# Patient Record
Sex: Male | Born: 1980 | Race: White | Hispanic: No
Health system: Southern US, Community
[De-identification: ages and names within clinical notes are randomized; demographics above are authoritative.]

---

## 2010-06-30 ENCOUNTER — Emergency Department (HOSPITAL_COMMUNITY): Admission: EM | Admit: 2010-06-30 | Discharge: 2010-07-01 | Payer: Self-pay | Admitting: Emergency Medicine

## 2011-11-30 IMAGING — CT CT HEAD W/O CM
1 of 2 series · 16 of 30 positions shown, 20 images · non-contrast
Comparison: None

CLINICAL DATA: Blurred vision, memory loss and confusion, pain,
injury boxing, left periorbital ecchymosis

CT HEAD WITHOUT CONTRAST
TECHNIQUE: Contiguous axial images were obtained from the base of
the skull through the vertex without contrast.

[Series 3: headseq 2.4 h60s · axial · 0.43mm/px · z∈[-186,-34]mm · 16 of 72 slices shown, 20 images]
[im 4/72  brain]
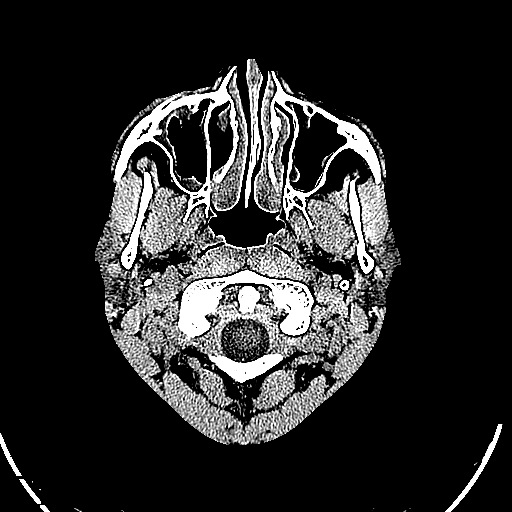
[im 4/72  bone]
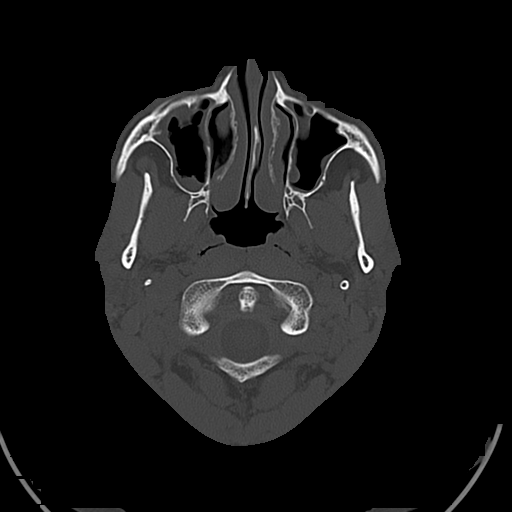
[im 8/72  brain]
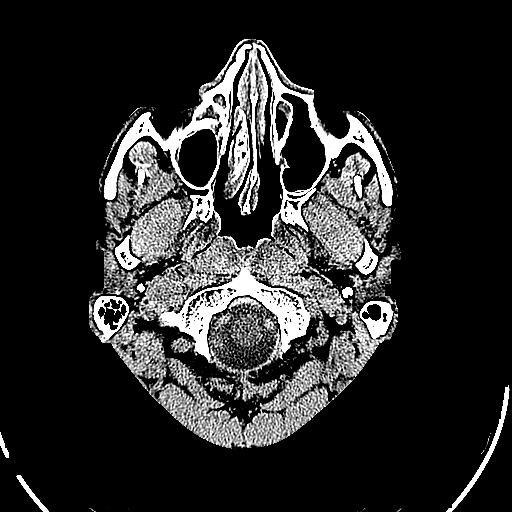
[im 12/72  brain]
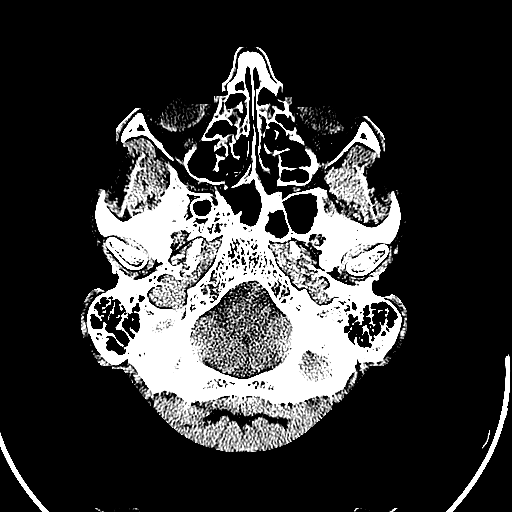
[im 15/72  brain]
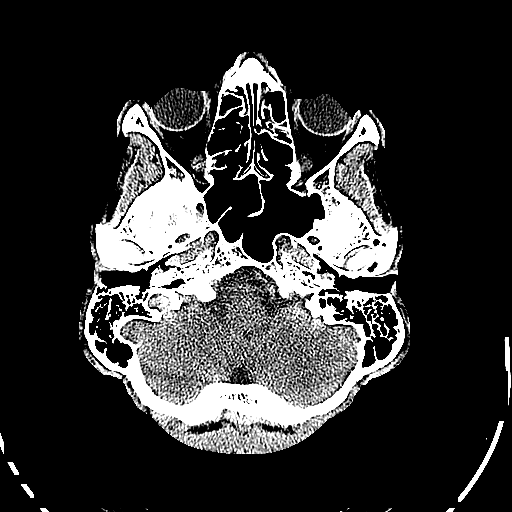
[im 23/72  brain]
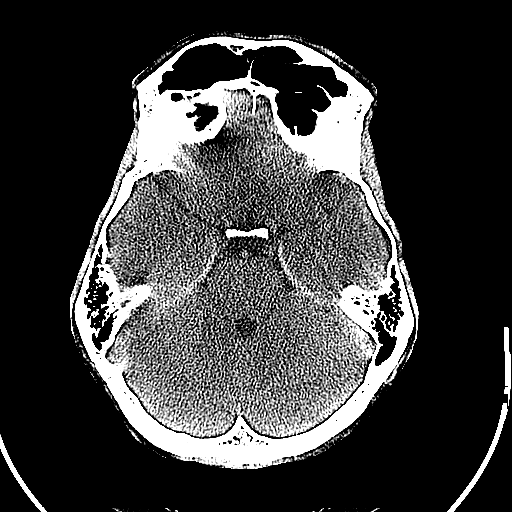
[im 23/72  bone]
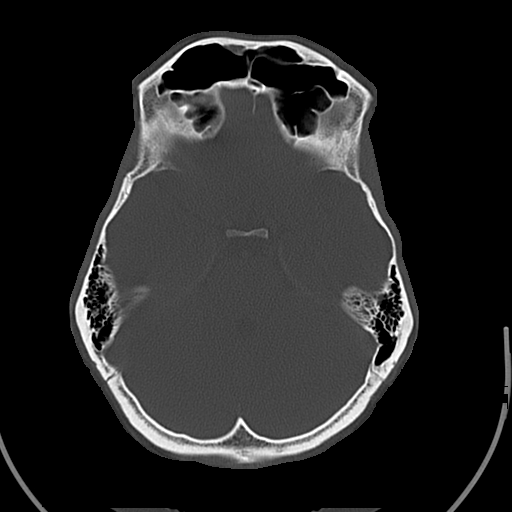
[im 27/72  brain]
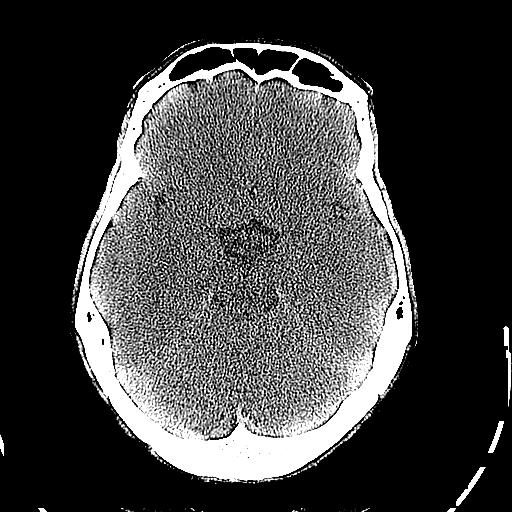
[im 30/72  brain]
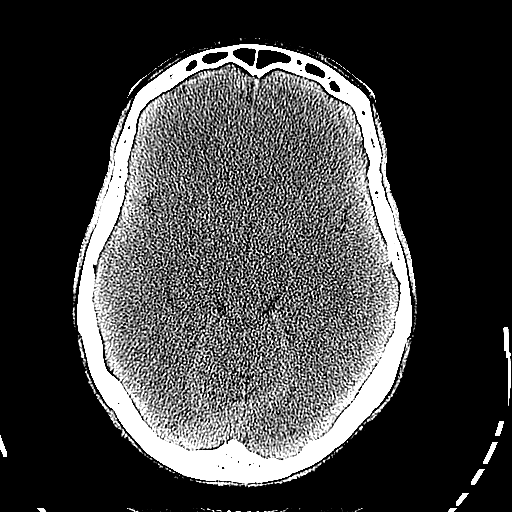
[im 34/72  brain]
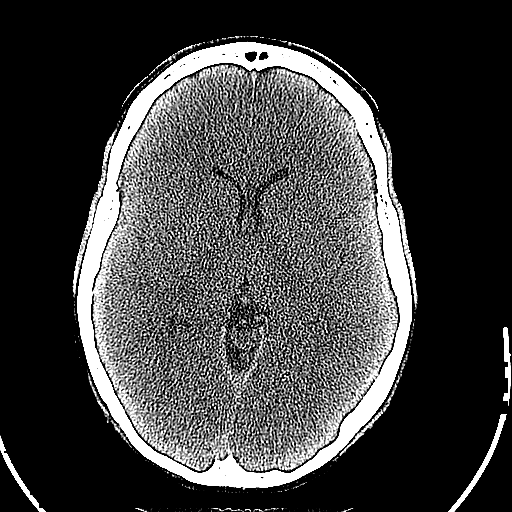
[im 38/72  brain]
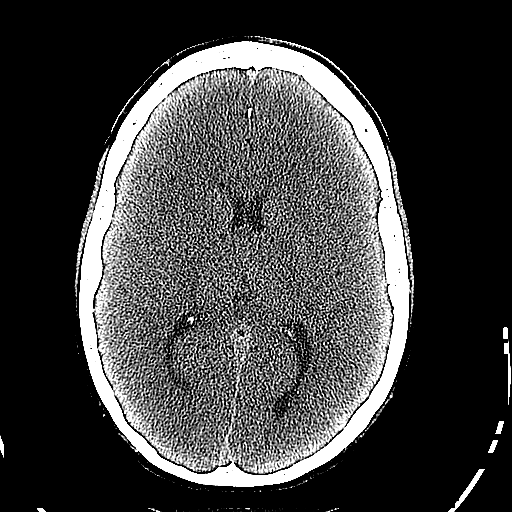
[im 38/72  bone]
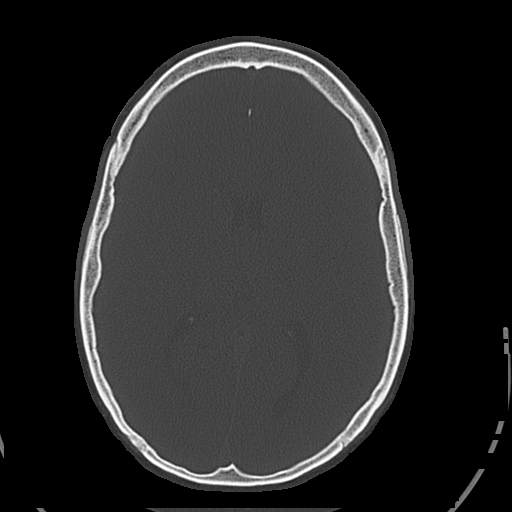
[im 42/72  brain]
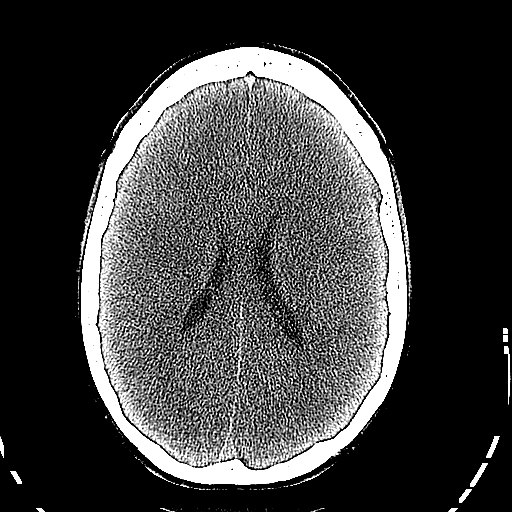
[im 45/72  brain]
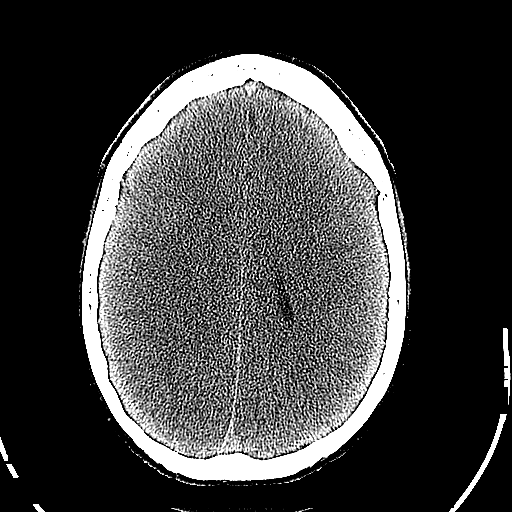
[im 49/72  brain]
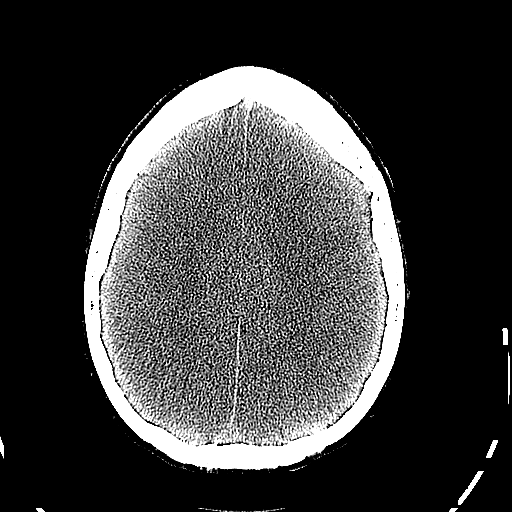
[im 57/72  brain]
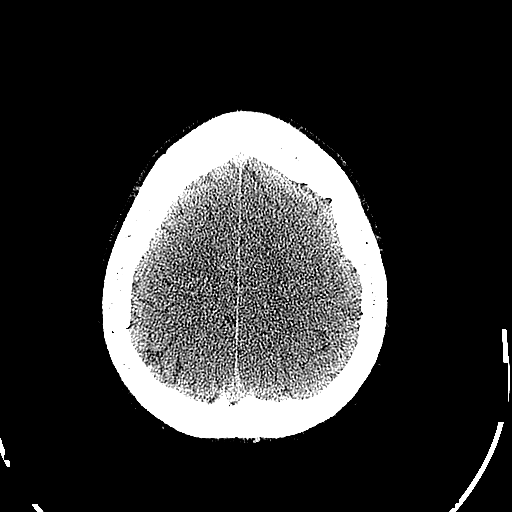
[im 57/72  bone]
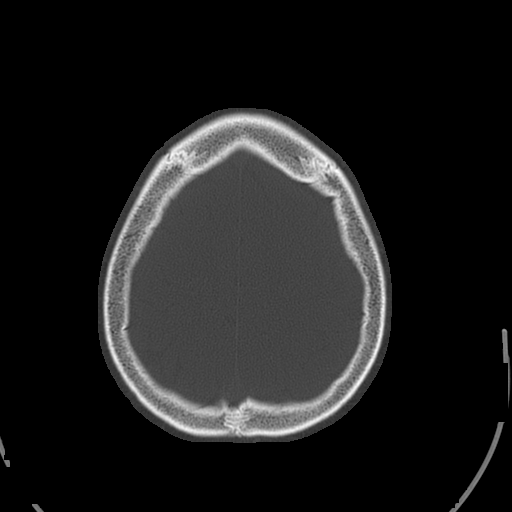
[im 60/72  brain]
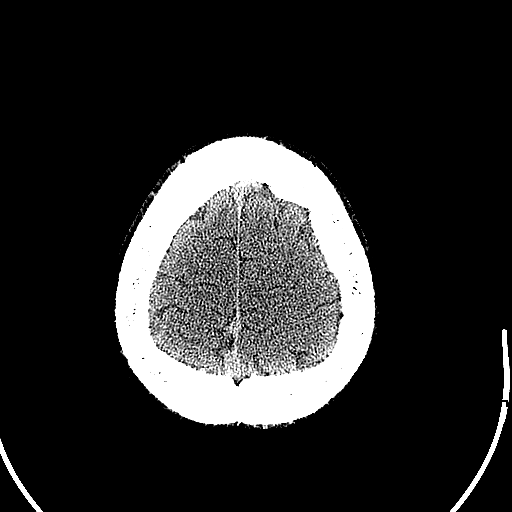
[im 64/72  brain]
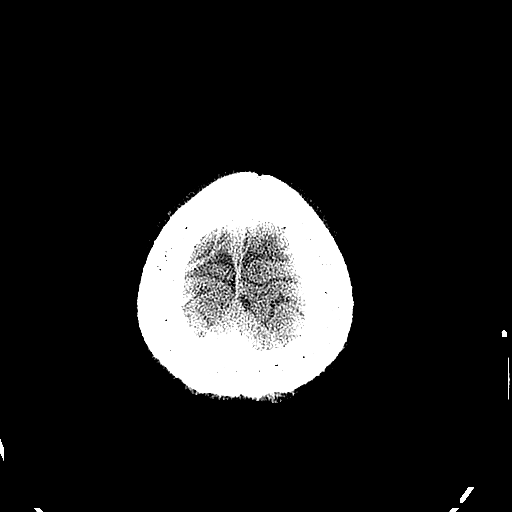
[im 68/72  brain]
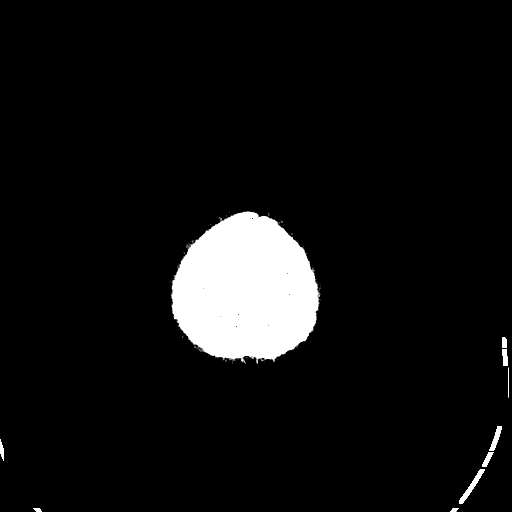

[16 of 30 positions shown; findings below may reference images not displayed]

FINDINGS: Normal ventricular morphology.
No midline shift or mass effect.
Normal appearance of brain parenchyma.
No intracranial hemorrhage, mass lesion, or evidence of acute
infarction.
Osseous structures intact.
Mucosal thickening scattered ethmoid air cells and bilateral
maxillary sinuses, with small mucosal retention cyst in left
maxillary sinus.
IMPRESSION: No acute intracranial abnormalities.

## 2023-01-31 ENCOUNTER — Other Ambulatory Visit: Payer: Self-pay

## 2023-01-31 ENCOUNTER — Other Ambulatory Visit (HOSPITAL_COMMUNITY)
Admission: EM | Admit: 2023-01-31 | Discharge: 2023-02-10 | Payer: Medicaid Other | Attending: Psychiatry | Admitting: Psychiatry

## 2023-01-31 DIAGNOSIS — F22 Delusional disorders: Secondary | ICD-10-CM | POA: Insufficient documentation

## 2023-01-31 DIAGNOSIS — F1514 Other stimulant abuse with stimulant-induced mood disorder: Secondary | ICD-10-CM | POA: Insufficient documentation

## 2023-01-31 DIAGNOSIS — F1994 Other psychoactive substance use, unspecified with psychoactive substance-induced mood disorder: Secondary | ICD-10-CM | POA: Diagnosis not present

## 2023-01-31 DIAGNOSIS — Z79899 Other long term (current) drug therapy: Secondary | ICD-10-CM | POA: Insufficient documentation

## 2023-01-31 DIAGNOSIS — R450 Nervousness: Secondary | ICD-10-CM | POA: Insufficient documentation

## 2023-01-31 DIAGNOSIS — F29 Unspecified psychosis not due to a substance or known physiological condition: Secondary | ICD-10-CM | POA: Insufficient documentation

## 2023-01-31 DIAGNOSIS — F1910 Other psychoactive substance abuse, uncomplicated: Secondary | ICD-10-CM | POA: Diagnosis present

## 2023-01-31 LAB — POCT URINE DRUG SCREEN - MANUAL ENTRY (I-SCREEN)
POC Amphetamine UR: POSITIVE — AB
POC Buprenorphine (BUP): NOT DETECTED
POC Cocaine UR: NOT DETECTED
POC Marijuana UR: NOT DETECTED
POC Methadone UR: NOT DETECTED
POC Methamphetamine UR: POSITIVE — AB
POC Morphine: NOT DETECTED
POC Oxazepam (BZO): POSITIVE — AB
POC Oxycodone UR: NOT DETECTED
POC Secobarbital (BAR): NOT DETECTED

## 2023-01-31 LAB — URINALYSIS, ROUTINE W REFLEX MICROSCOPIC
Bacteria, UA: NONE SEEN
Bilirubin Urine: NEGATIVE
Glucose, UA: NEGATIVE mg/dL
Ketones, ur: NEGATIVE mg/dL
Leukocytes,Ua: NEGATIVE
Nitrite: NEGATIVE
Protein, ur: NEGATIVE mg/dL
Specific Gravity, Urine: 1.006 (ref 1.005–1.030)
pH: 6 (ref 5.0–8.0)

## 2023-01-31 MED ORDER — BUPROPION HCL ER (XL) 300 MG PO TB24
300.0000 mg | ORAL_TABLET | Freq: Once | ORAL | Status: DC
  Filled 2023-01-31: qty 1

## 2023-01-31 MED ORDER — ONDANSETRON 4 MG PO TBDP: 4.0000 mg | ORAL_TABLET | Freq: Four times a day (QID) | ORAL | Status: DC | PRN

## 2023-01-31 MED ORDER — MAGNESIUM HYDROXIDE 400 MG/5ML PO SUSP: 30.0000 mL | Freq: Every day | ORAL | Status: DC | PRN

## 2023-01-31 MED ORDER — METHOCARBAMOL 500 MG PO TABS: 500.0000 mg | ORAL_TABLET | Freq: Three times a day (TID) | ORAL | Status: AC | PRN

## 2023-01-31 MED ORDER — QUETIAPINE FUMARATE 100 MG PO TABS
100.0000 mg | ORAL_TABLET | Freq: Once | ORAL | Status: AC
  Administered 2023-01-31: 100 mg via ORAL
  Filled 2023-01-31: qty 1

## 2023-01-31 MED ORDER — GABAPENTIN 300 MG PO CAPS
300.0000 mg | ORAL_CAPSULE | Freq: Three times a day (TID) | ORAL | Status: DC
  Administered 2023-02-01 – 2023-02-02 (×5): 300 mg via ORAL
  Filled 2023-01-31 (×7): qty 1

## 2023-01-31 MED ORDER — TRAZODONE HCL 50 MG PO TABS
50.0000 mg | ORAL_TABLET | Freq: Every evening | ORAL | Status: DC | PRN
  Administered 2023-02-01 – 2023-02-06 (×5): 50 mg via ORAL
  Filled 2023-01-31 (×7): qty 1

## 2023-01-31 MED ORDER — OLANZAPINE 15 MG PO TBDP
15.0000 mg | ORAL_TABLET | Freq: Every day | ORAL | Status: DC
  Filled 2023-01-31: qty 1

## 2023-01-31 MED ORDER — HYDROXYZINE HCL 25 MG PO TABS
25.0000 mg | ORAL_TABLET | Freq: Four times a day (QID) | ORAL | Status: DC | PRN
  Administered 2023-02-01: 25 mg via ORAL
  Filled 2023-01-31 (×2): qty 1

## 2023-01-31 MED ORDER — DICYCLOMINE HCL 20 MG PO TABS: 20.0000 mg | ORAL_TABLET | Freq: Four times a day (QID) | ORAL | Status: DC | PRN

## 2023-01-31 MED ORDER — ACETAMINOPHEN 325 MG PO TABS: 650.0000 mg | ORAL_TABLET | Freq: Four times a day (QID) | ORAL | Status: DC | PRN

## 2023-01-31 MED ORDER — ALUM & MAG HYDROXIDE-SIMETH 200-200-20 MG/5ML PO SUSP: 30.0000 mL | ORAL | Status: DC | PRN

## 2023-01-31 MED ORDER — NAPROXEN 500 MG PO TABS: 500.0000 mg | ORAL_TABLET | Freq: Two times a day (BID) | ORAL | Status: DC | PRN

## 2023-01-31 MED ORDER — LOPERAMIDE HCL 2 MG PO CAPS: 2.0000 mg | ORAL_CAPSULE | ORAL | Status: DC | PRN

## 2023-01-31 NOTE — ED Notes (Signed)
Pt laying in bed calm and cooperative. Will continue to monitor for safety

## 2023-01-31 NOTE — Discharge Instructions (Addendum)
Dear Wesley Lee,  It was a pleasure to take care of you during your stay at [facility] where you were treated for your Substance induced mood disorder (HCC).  While you were here, you were:  observed and cared for by our nurses and nursing assistants  treated with medications by your psychiatrists  provided individual and group therapy by therapists  provided resources by our social workers and case managers  Please review the medication list provided to you at discharge and stop, start taking, or continue taking the medications listed there.  You should also follow-up with your primary care doctor, or start seeing one if you don't have one yet. If applicable, here are some scheduled follow-ups for you:    I recommend abstinence from alcohol, tobacco, and other illicit drug use.   If your psychiatric symptoms or suicidal thoughts recur, worsen, or if you have side effects to your psychiatric medications, call your outpatient psychiatric provider, 911, [phone] or go to the nearest emergency department.  Take care!  Signed: Augusto Gamble, MD 02/07/2023, 8:56 AM  Naloxone (Narcan) can help reverse an overdose when given to the victim quickly.  [place] offers free naloxone kits and instructions/training on its use.  Add naloxone to your first aid kit and you can help save a life. A prescription can be filled at your local pharmacy or free kits are provided by the county.  Pick up your free kit at the following locations:   [place]:  [facility], [address] [place] [address] [phone]) [facility] [address] [place] [address] [phone]) [facility] [address] [place] [address]  [place]: [facility] [address] [place] [address] ([phone]) [facility] [address] [place]  [place] [address] [phone])  Patient will be discharging to [facility] on [address] [place], [place] [address]: [phone]; Dagoberto [address] of Court Contact information is: Mrs. Raynelle Chary [phone]   HALFWAY HOUSES:  [facility] www.oxfordvacancies.com  12 STEP PROGRAMS:  Alcoholics Anonymous of [place] SoftwareChalet.be  Narcotics Anonymous of [place] HitProtect.dk  Al-Anon of [place], [place] www.[place]alanon.org/find-meetings.html  Nar-Anon https://nar-anon.org/find-a-meetin  List of Residential placements:   [facility] in [place]: [phone]  [facility]: [phone]  [place], [place] [phone]: Male and male facility; 30-day program: (uninsured and Medicaid such as Laurena Bering, [place], [place], partners)  [facility]: [phone]; men and women's facility; 28 days; Can have Medicaid tailored plan Tour manager or Partners)  [facility]: [phone] Karoline Caldwell or Larita Fife; 28 day program; must be fully detox; tailored Medicaid or no insurance  [address] in [place], [place]; [phone]; 28 day all males program; no insurance accepted  [facility] Ref[facility] in [place]: Gabriel Rung [phone] (no insurance or Medicaid only); 90 days; outpatient services but provide housing in apartments [place]  [facility] Admission: [phone]: Patient must complete phone screening for placement: [place], [place]; 6 month program; uninsured, Medicaid, and [employer].   [facility]: no insurance required; [phone]  [facility]: [phone]; Intake: Molly Maduro; Must fill out application online; Alecia Lemming Delay [phone] x [address] [facility] in [place], [place]: [phone]; Admissions Coordinators Mr. Maurine Minister or Barron Alvine; 90 day program.  [facility]: [place], [place] [phone]; Co-Ed 9 month to a  year program; Online application; Men entry fee is $500 (6-69months);  [facility]: [address] [place], [place] [address]; no fee or insurance required; minimum of 2 years; Highly structured; work based; Intake Coordinator is Thayer Ohm [phone]  [facility] in [place], [place]: [phone]; Fax number is [phone]; website: www.[facility].org; Requires 3-6 page autobiography; 2 year program (18 months and then 10month transitional housing); Admission fee is $300; no insurance needed; work Automotive engineer in [place], [place]: [employer] Desk Staff: Danise Edge [phone]: They have a Men's Regenerations Program 6-79months. Free program; There is an initial $300 fee however, they are willing to work with patients regarding that. Application is online.  [facility]: Admissions [phone] Doran Heater ext [phone]; Any 7-90 day program is out of pocket; 12 month program is free of charge; there is a $275 entry fee; Patient is responsible for own transportation

## 2023-01-31 NOTE — ED Provider Notes (Signed)
[facility] Continuous Assessment Admission H&P  Date: 01/31/23 Patient Name: Wesley Lee MRN: [id number] Chief Complaint: "I was sent  Diagnoses:  Final diagnoses:  Substance induced mood disorder [facility])   HPI:  Wesley Lee 42 y.o., male patient presented to [facility] as a walk in , voluntaruky  accompanied by *** with complaints of ***  Wesley Lee, 42 y.o., male patient seen face to face by this provider, consulted with Dr. ***; and chart reviewed on 01/31/23.    On evaluation Wesley Lee reports  During evaluation Wesley Lee is ***(position) in no acute distress.  ***He/She is alert, oriented x 4, calm, cooperative and attentive.  ***His/Her mood is ***euthymic with congruent affect.  ***He/She has normal speech, and behavior.  Objectively there is no evidence of psychosis/mania or delusional thinking.  Patient is able to converse coherently, goal directed thoughts, no distractibility, or pre-occupation.  ***He/She also denies suicidal/self-harm/homicidal ideation, psychosis, and paranoia.  Patient answered question appropriately.      Total Time spent with patient: 45 minutes  Psychiatric Specialty Exam  Presentation General Appearance:  Appropriate for Environment  Eye Contact: Fleeting  Speech: Garbled  Speech Volume: Normal  Handedness: Right   Mood and Affect  Mood: Euphoric  Affect: Appropriate   Thought Process  Thought Processes:No data recorded Descriptions of Associations:Circumstantial  Orientation:Full (Time, Place and Person)  Thought Content:Paranoid Ideation  Diagnosis of Schizophrenia or Schizoaffective disorder in past: No  Duration of Psychotic Symptoms: Less than six months  Hallucinations:Hallucinations: None  Ideas of Reference:None  Suicidal Thoughts:Suicidal Thoughts: No  Homicidal Thoughts:Homicidal Thoughts: No   Sensorium  Memory: Immediate Good; Recent  Good  Judgment: Impaired  Insight: Lacking   Executive Functions  Concentration: Poor  Attention Span: Poor  Recall: [employer] of Knowledge: Fair  Language: Fair   Psychomotor Activity  Psychomotor Activity: Psychomotor Activity: Restlessness   Assets  Assets: Physical Health; Desire for Improvement; Resilience   Sleep  Sleep: Sleep: Poor   No data recorded  Physical Exam ROS  Blood pressure 137/87, pulse 72, temperature 98 F (36.7 C), temperature source Oral, resp. rate 18, SpO2 98 %. There is no height or weight on file to calculate BMI.  Past Psychiatric History: ***   Is the patient at risk to self? No  Has the patient been a risk to self in the past 6 months? No .    Has the patient been a risk to self within the distant past? No     Past Medical History: ***  Family History: ***  Social History: ***    Allergies: Patient has no allergy information on record.  Medications:  Facility Ordered Medications  Medication   acetaminophen (TYLENOL) tablet 650 mg   alum & mag hydroxide-simeth (MAALOX/MYLANTA) 200-200-20 MG/5ML suspension 30 mL   magnesium hydroxide (MILK OF MAGNESIA) suspension 30 mL   traZODone (DESYREL) tablet 50 mg   dicyclomine (BENTYL) tablet 20 mg   hydrOXYzine (ATARAX) tablet 25 mg   loperamide (IMODIUM) capsule 2-4 mg   methocarbamol (ROBAXIN) tablet 500 mg   naproxen (NAPROSYN) tablet 500 mg   ondansetron (ZOFRAN-ODT) disintegrating tablet 4 mg   gabapentin (NEURONTIN) capsule 300 mg   buPROPion (WELLBUTRIN XL) 24 hr tablet 300 mg   OLANZapine zydis (ZYPREXA) disintegrating tablet 15 mg      Medical Decision Making  ***   Patient at present is refusing labs appears manic and paranoid,  Seroquel is on hold, starting Olanzapine.   Recommendations  {  [facility] MSE Recommendations:304701}  Joaquin Courts, NP 01/31/23  5:25 PM

## 2023-01-31 NOTE — ED Notes (Signed)
Patient refusing EKG and blood work - providers made aware

## 2023-01-31 NOTE — BH Assessment (Signed)
Comprehensive Clinical Assessment (CCA) Note  01/31/2023 Wesley Lee [id number] DISPOSITION: Tiburcio Pea NP recommends patient to [facility] once he is less impaired. Patient is recommended for continuous observation at this time for further monitoring.   The patient demonstrates the following risk factors for suicide: Chronic risk factors for suicide include: N/A. Acute risk factors for suicide include: N/A. Protective factors for this patient include: coping skills. Considering these factors, the overall suicide risk at this point appears to be low. Patient is appropriate for outpatient follow up.   Patient is a 42 year old male that presents voluntary as a walk in to [facility] requesting assistance with ongoing SA issues. Patient denies any S/I, H/I or AVH. Patient is observed to be actively impaired and cannot participate in the assessment process. Patient is tangential and difficult to redirect. History is limited per chart review. Patient states he has been using various amounts of methamphetamines although due to current AMS is unable to render any history in reference to current use patterns, time frame, method of ingestion and amounts used. Patient reported he last used three days ago per triage notes although as noted, patient is actively impaired at the time of assessment. Patient per triage note states that patient was sent here as a referral from [facility] in [place] for detox although patient denies that he was ever seen at [facility]. Patient is displaying active flight of ideas and is rambling about topics unrelated to assessment questions. It is unclear if patient is responding to internal stimuli. No additional information could be obtained at this time.   Patient is not answering questions associated with orientation. Patient is displaying active flight of ideas and difficult to redirect. Patient speaks in a loud voice and is very animated walking around assessment room making hand gestures. Patient's memory  is impaired with thoughts disorganized. Patient's mood is agitated/anxious with affect congruent. It is unclear if patient is responding to internal stimuli.         Chief Complaint:  Chief Complaint  Patient presents with   Addiction Problem   Visit Diagnosis: SA induced mood disorder.     CCA Screening, Triage and Referral (STR)  Patient Reported Information How did you hear about [place]? Self  What Is the Reason for Your Visit/Call Today? Pt presents to [facility] voluntarily seeking detox and substance use treatment. Pt states he was sent to this facility from [facility] in [place]. Pt appears to be under the influence of a substance; slurred speech, inability to sit still, unable to keep eye contact. 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 Pt denies SI/HI and AVH.  How Long Has This Been Causing You Problems? > than 6 months  What Do You Feel Would Help You the Most Today? Alcohol or Drug Use Treatment   Have You Recently Had Any Thoughts About Hurting Yourself? No  Are You Planning to Commit Suicide/Harm Yourself At This time? No   Flowsheet Row ED from 01/31/2023 in [facility]  C-SSRS RISK CATEGORY No Risk       Have you Recently Had Thoughts About Hurting Someone Karolee Ohs? No  Are You Planning to Harm Someone at This Time? No  Explanation: NA   Have You Used Any Alcohol or Drugs in the Past 24 Hours? No  What Did You Use and How Much? Patient states last use of any substances was three days ago   Do You Currently Have a Therapist/Psychiatrist? No  Name of Therapist/Psychiatrist: Name of Therapist/Psychiatrist: NA   Have You Been Recently Discharged From Any Office Practice or Programs? No  Explanation of Discharge From Practice/Program: NA     CCA Screening Triage Referral Assessment Type of Contact: Face-to-Face  Telemedicine Service Delivery:   Is this Initial or Reassessment?   Date Telepsych  consult ordered in [facility]:    Time Telepsych consult ordered in [facility]:    Location of Assessment: [facility]  Provider Location: [facility] Services   Collateral Involvement: None at this time   Does Patient Have a Automotive engineer Guardian? No  Legal Guardian Contact Information: NA  Copy of Legal Guardianship Form: -- (NA)  Legal Guardian Notified of Arrival: -- (NA)  Legal Guardian Notified of Pending Discharge: -- (NA)  If Minor and Not Living with Parent(s), Who has Custody? NA  Is CPS involved or ever been involved? Never  Is APS involved or ever been involved? Never   Patient Determined To Be At Risk for Harm To Self or Others Based on Review of Patient Reported Information or Presenting Complaint? No  Method: No Plan  Availability of Means: No access or NA  Intent: Vague intent or NA  Notification Required: No need or identified person  Additional Information for Danger to Others Potential: -- (NA)  Additional Comments for Danger to Others Potential: None noted  Are There Guns or Other Weapons in Your Home? No  Types of Guns/Weapons: Patient denies  Are These Weapons Safely Secured?                            -- (NA)  Who Could Verify You Are Able To Have These Secured: NA  Do You Have any Outstanding Charges, Pending Court Dates, Parole/Probation? Patient denies  Contacted To Inform of Risk of Harm To Self or Others: Other: Comment (NA)    Does Patient Present under Involuntary Commitment? No    [place] of Residence: [place]   Patient Currently Receiving the Following Services: Not Receiving Services   Determination of Need: Urgent (48 hours)   Options For Referral: Facility-Based Crisis     CCA Biopsychosocial Patient Reported Schizophrenia/Schizoaffective Diagnosis in Past: No   Strengths: Patient is willing to participate in treatment and is open to recovery interventions   Mental Health  Symptoms Depression:   Change in energy/activity; Difficulty Concentrating; Hopelessness   Duration of Depressive symptoms:  Duration of Depressive Symptoms: Greater than two weeks   Mania:   None   Anxiety:    Difficulty concentrating   Psychosis:   None   Duration of Psychotic symptoms:  Duration of Psychotic Symptoms: N/A   Trauma:   None   Obsessions:   None   Compulsions:   None   Inattention:   None   Hyperactivity/Impulsivity:   None   Oppositional/Defiant Behaviors:   None   Emotional Irregularity:   Chronic feelings of emptiness   Other Mood/Personality Symptoms:   None noted    Mental Status Exam Appearance and self-care  Stature:   Average   Weight:   Average weight   Clothing:   Casual   Grooming:   Normal   Cosmetic use:   None   Posture/gait:   Bizarre   Motor activity:   Restless   Sensorium  Attention:   Distractible   Concentration:   Preoccupied; Scattered   Orientation:   X5   Recall/memory:   Defective  in Immediate   Affect and Mood  Affect:   Anxious   Mood:   Anxious   Relating  Eye contact:   Fleeting   Facial expression:   Anxious   Attitude toward examiner:   Cooperative   Thought and Language  Speech flow:  Soft; Slurred   Thought content:   Appropriate to Mood and Circumstances   Preoccupation:   None   Hallucinations:   None   Organization:   Disorganized   Company secretary of Knowledge:   Poor   Intelligence:   Needs investigation   Abstraction:   Functional   Judgement:   Impaired   Reality Testing:   Variable   Insight:   Fair   Decision Making:   Only simple   Social Functioning  Social Maturity:   Responsible   Social Judgement:   Normal   Stress  Stressors:   Other (Comment) (Ongoing SA issues)   Coping Ability:   Overwhelmed   Skill Deficits:   Self-control   Supports:   Usual     Religion: Religion/Spirituality Are  You A Religious Person?: No How Might This Affect Treatment?: NA  Leisure/Recreation: Leisure / Recreation Do You Have Hobbies?: No  Exercise/Diet: Exercise/Diet Do You Exercise?: No Have You Gained or Lost A Significant Amount of Weight in the Past Six Months?: No Do You Follow a Special Diet?: No Do You Have Any Trouble Sleeping?: Yes Explanation of Sleeping Difficulties: Patient states he has not slept in the last 48 hours   CCA Employment/Education Employment/Work Situation: Employment / Work Situation Employment Situation: Unemployed Patient's Job has Been Impacted by Current Illness: No Has Patient ever Been in Equities trader?: No  Education: Education Is Patient Currently Attending School?: No Last Grade Completed: 12 Did You Product manager?: No Did You Have An Individualized Education Program (IIEP): No Did You Have Any Difficulty At [employer]?: No Patient's Education Has Been Impacted by Current Illness: No   CCA Family/Childhood History Family and Relationship History: Family history Marital status: Single Does patient have children?: No  Childhood History:  Childhood History By whom was/is the patient raised?: Both parents Did patient suffer any verbal/emotional/physical/sexual abuse as a child?: No Did patient suffer from severe childhood neglect?: No Has patient ever been sexually abused/assaulted/raped as an adolescent or adult?: No Was the patient ever a victim of a crime or a disaster?: No Witnessed domestic violence?: No Has patient been affected by domestic violence as an adult?: No       CCA Substance Use Alcohol/Drug Use: Alcohol / Drug Use Pain Medications: See MAR Prescriptions: See MAR Over the Counter: See MAR History of alcohol / drug use?: Yes Longest period of sobriety (when/how long): Unknown UTA due patient AMS Negative Consequences of Use:  (UTA) Withdrawal Symptoms:  (UTA) Substance #1 Name of Substance 1: Methamphetamines 1 -  Age of First Use: UTA due to patient being actively impaired at the time of assessment 1 - Amount (size/oz): UTA due to patient being actively impaired at the time of assessment 1 - Frequency: UTA due to patient being actively impaired at the time of assessment 1 - Duration: UTA due to patient being actively impaired at the time of assessment 1 - Last Use / Amount: patient per notes reported 3 days ago although per observation patient is currently impaired 1 - Method of Aquiring: illegally 1- Route of Use: UTA  ASAM's:  Six Dimensions of Multidimensional Assessment  Dimension 1:  Acute Intoxication and/or Withdrawal Potential:   Dimension 1:  Description of individual's past and current experiences of substance use and withdrawal: UTA  Dimension 2:  Biomedical Conditions and Complications:   Dimension 2:  Description of patient's biomedical conditions and  complications: UTA  Dimension 3:  Emotional, Behavioral, or Cognitive Conditions and Complications:  Dimension 3:  Description of emotional, behavioral, or cognitive conditions and complications: UTA  Dimension 4:  Readiness to Change:  Dimension 4:  Description of Readiness to Change criteria: UTA  Dimension 5:  Relapse, Continued use, or Continued Problem Potential:  Dimension 5:  Relapse, continued use, or continued problem potential critiera description: UTA  Dimension 6:  Recovery/Living Environment:  Dimension 6:  Recovery/Iiving environment criteria description: UTA  ASAM Severity Score:    ASAM Recommended Level of Treatment: ASAM Recommended Level of Treatment:  (UTA)   Substance use Disorder (SUD) Substance Use Disorder (SUD)  Checklist Symptoms of Substance Use:  (UTA)  Recommendations for Services/Supports/Treatments: Recommendations for Services/Supports/Treatments Recommendations For Services/Supports/Treatments: Other (Comment) (FBC once patient becomes more stable)  Discharge  Disposition:    DSM5 Diagnoses: There are no problems to display for this patient.    Referrals to Alternative Service(s): Referred to Alternative Service(s):   Place:   Date:   Time:    Referred to Alternative Service(s):   Place:   Date:   Time:    Referred to Alternative Service(s):   Place:   Date:   Time:    Referred to Alternative Service(s):   Place:   Date:   Time:     Alfredia Ferguson, LCAS

## 2023-02-01 ENCOUNTER — Encounter (HOSPITAL_COMMUNITY): Payer: Self-pay | Admitting: Registered Nurse

## 2023-02-01 DIAGNOSIS — F1994 Other psychoactive substance use, unspecified with psychoactive substance-induced mood disorder: Secondary | ICD-10-CM | POA: Diagnosis not present

## 2023-02-01 DIAGNOSIS — F1514 Other stimulant abuse with stimulant-induced mood disorder: Secondary | ICD-10-CM | POA: Diagnosis not present

## 2023-02-01 DIAGNOSIS — F1910 Other psychoactive substance abuse, uncomplicated: Secondary | ICD-10-CM | POA: Diagnosis present

## 2023-02-01 DIAGNOSIS — R450 Nervousness: Secondary | ICD-10-CM | POA: Diagnosis not present

## 2023-02-01 DIAGNOSIS — F22 Delusional disorders: Secondary | ICD-10-CM | POA: Diagnosis not present

## 2023-02-01 MED ORDER — QUETIAPINE FUMARATE 100 MG PO TABS
100.0000 mg | ORAL_TABLET | Freq: Once | ORAL | Status: AC
  Administered 2023-02-01: 100 mg via ORAL
  Filled 2023-02-01: qty 1

## 2023-02-01 NOTE — ED Provider Notes (Addendum)
Facility Based Crisis Admission H&P  Date: 02/03/23 Patient Name: Wesley Lee MRN: [id number] Chief Complaint: Detox request  Diagnoses:  Final diagnoses:  Substance induced mood disorder (HCC)  Polysubstance abuse [facility])    HPI: Wesley Lee 42 y/o, male patient presented to [facility] as a walk in voluntarily requesting assistance with detox and treatment.    Wesley Lee seen face to face by this provider, consulted with Dr. Gretta Cool; and chart reviewed on 02/03/23.  On evaluation Wesley Lee reports he feels "alright"  States that he cam in because he needed detox for meth and alcohol.  Patient states that he was stay at a half way house "I can't remember the name but [facility] is who help me get in.  But they said that I had to do a detox program first."  Patient states that he is eating/sleeping without difficulty.  He denies suicidal/self-harm/homicidal ideation, psychosis, and paranoia.   During evaluation Wesley Lee is lying in bed with no noted distress.  He is alert/oriented x 4, calm, cooperative, attentive, and responses were relevant and appropriate to assessment questions.  He spoke in a clear tone at moderate volume, and normal pace, with good eye contact.   He denies suicidal/self-harm/homicidal ideation, psychosis, and paranoia.  Objectively:  there is no evidence of psychosis/mania or delusional thinking.  He conversed coherently, with goal directed thoughts, and no distractibility, or pre-occupation.     PHQ 2-9:   Flowsheet Row ED from 01/31/2023 in [facility]  C-SSRS RISK CATEGORY No Risk       Screenings    Flowsheet Row Most Recent Value  CIWA-Ar Total 0  COWS Total Score 0      Total Time spent with patient: 30 minutes  Musculoskeletal  Strength & Muscle Tone: within normal limits Gait & Station: normal Patient leans: N/A  Psychiatric Specialty Exam  Presentation General Appearance:  Appropriate  for Environment  Eye Contact: Fair  Speech: Clear and Coherent; Normal Rate  Speech Volume: Normal  Handedness: Right   Mood and Affect  Mood: Euthymic  Affect: Congruent   Thought Process  Thought Processes:Coherent  Descriptions of Associations:Intact  Orientation:Full (Time, Place and Person)  Thought Content:Logical  Diagnosis of Schizophrenia or Schizoaffective disorder in past: No   Hallucinations:Hallucinations: None  Ideas of Reference:None  Suicidal Thoughts:Suicidal Thoughts: No  Homicidal Thoughts:Homicidal Thoughts: No   Sensorium  Memory: -- (Grossly intact)  Judgment: Fair  Insight: Fair   Art therapist  Concentration: Fair  Attention Span: Fair  Recall: -- (N/A)  Fund of Knowledge: Fair  Language: Fair   Psychomotor Activity  Psychomotor Activity: Psychomotor Activity: Normal   Assets  Assets: Desire for Improvement; Communication Skills; Resilience   Sleep  Sleep: Sleep: Good   No data recorded  Physical Exam Vitals and nursing note reviewed.  Constitutional:      General: He is not in acute distress.    Appearance: Normal appearance. He is not ill-appearing.  HENT:     Head: Normocephalic.  Eyes:     Conjunctiva/sclera: Conjunctivae normal.  Cardiovascular:     Rate and Rhythm: Normal rate.  Pulmonary:     Effort: Pulmonary effort is normal.  Musculoskeletal:        General: Normal range of motion.     Cervical back: Normal range of motion.  Skin:    General: Skin is warm and dry.  Neurological:     Mental Status: He is alert and  oriented to person, place, and time.  Psychiatric:        Attention and Perception: Attention and perception normal. He does not perceive auditory or visual hallucinations.        Mood and Affect: Mood and affect normal.        Speech: Speech normal.        Behavior: Behavior normal. Behavior is cooperative.        Thought Content: Thought content normal.  Thought content is not paranoid or delusional. Thought content does not include homicidal or suicidal ideation.        Cognition and Memory: Cognition normal.    Review of Systems  Constitutional:        No complaints voiced   Psychiatric/Behavioral:  Positive for substance abuse. Negative for hallucinations and suicidal ideas. Depression: Stable.The patient does not have insomnia.   All other systems reviewed and are negative.   Blood pressure 133/89, pulse 66, temperature (!) 97.4 F (36.3 C), temperature source Oral, resp. rate 17, SpO2 97 %. There is no height or weight on file to calculate BMI.  Past Psychiatric History:  Patient Active Problem List   Diagnosis Date Noted   Substance induced mood disorder (HCC) 02/01/2023   Polysubstance abuse (HCC) 02/01/2023      Is the patient at risk to self? No  Has the patient been a risk to self in the past 6 months? No .    Has the patient been a risk to self within the distant past? No   Is the patient a risk to others? No   Has the patient been a risk to others in the past 6 months? No   Has the patient been a risk to others within the distant past? No   Past Medical History: History reviewed. No pertinent past medical history.   None reported Family History: History reviewed. No pertinent family history.   None reported Social History: Polysubstance abuse, currently living in half way house  Last Labs:  Admission on 01/31/2023  Component Date Value Ref Range Status   Color, Urine 01/31/2023 YELLOW  YELLOW Final   APPearance 01/31/2023 CLEAR  CLEAR Final   Specific Gravity, Urine 01/31/2023 1.006  1.005 - 1.030 Final   pH 01/31/2023 6.0  5.0 - 8.0 Final   Glucose, UA 01/31/2023 NEGATIVE  NEGATIVE mg/dL Final   Hgb urine dipstick 01/31/2023 SMALL (A)  NEGATIVE Final   Bilirubin Urine 01/31/2023 NEGATIVE  NEGATIVE Final   Ketones, ur 01/31/2023 NEGATIVE  NEGATIVE mg/dL Final   Protein, ur 16/07/9603 NEGATIVE  NEGATIVE mg/dL  Final   Nitrite 54/05/8118 NEGATIVE  NEGATIVE Final   Leukocytes,Ua 01/31/2023 NEGATIVE  NEGATIVE Final   RBC / HPF 01/31/2023 0-5  0 - 5 RBC/hpf Final   WBC, UA 01/31/2023 0-5  0 - 5 WBC/hpf Final   Bacteria, UA 01/31/2023 NONE SEEN  NONE SEEN Final   Squamous Epithelial / HPF 01/31/2023 0-5  0 - 5 /HPF Final   Performed at [facility], [address] [place], [place] [address]   POC Amphetamine UR 01/31/2023 Positive (A)  NONE DETECTED (Cut Off Level 1000 ng/mL) Final   POC Secobarbital (BAR) 01/31/2023 None Detected  NONE DETECTED (Cut Off Level 300 ng/mL) Final   POC Buprenorphine (BUP) 01/31/2023 None Detected  NONE DETECTED (Cut Off Level 10 ng/mL) Final   POC Oxazepam (BZO) 01/31/2023 Positive (A)  NONE DETECTED (Cut Off Level 300 ng/mL) Final   POC Cocaine UR 01/31/2023  None Detected  NONE DETECTED (Cut Off Level 300 ng/mL) Final   POC Methamphetamine UR 01/31/2023 Positive (A)  NONE DETECTED (Cut Off Level 1000 ng/mL) Final   POC Morphine 01/31/2023 None Detected  NONE DETECTED (Cut Off Level 300 ng/mL) Final   POC Methadone UR 01/31/2023 None Detected  NONE DETECTED (Cut Off Level 300 ng/mL) Final   POC Oxycodone UR 01/31/2023 None Detected  NONE DETECTED (Cut Off Level 100 ng/mL) Final   POC Marijuana UR 01/31/2023 None Detected  NONE DETECTED (Cut Off Level 50 ng/mL) Final    Allergies: Patient has no known allergies.  Medications:  Facility Ordered Medications  Medication   acetaminophen (TYLENOL) tablet 650 mg   alum & mag hydroxide-simeth (MAALOX/MYLANTA) 200-200-20 MG/5ML suspension 30 mL   magnesium hydroxide (MILK OF MAGNESIA) suspension 30 mL   traZODone (DESYREL) tablet 50 mg   methocarbamol (ROBAXIN) tablet 500 mg   ondansetron (ZOFRAN-ODT) disintegrating tablet 4 mg   buPROPion (WELLBUTRIN XL) 24 hr tablet 300 mg   [COMPLETED] QUEtiapine (SEROQUEL) tablet 100 mg   [COMPLETED] QUEtiapine (SEROQUEL) tablet 100 mg   buPROPion (WELLBUTRIN XL) 24 hr tablet  300 mg   gabapentin (NEURONTIN) capsule 800 mg   thiamine (VITAMIN B1) injection 100 mg   thiamine (VITAMIN B1) tablet 100 mg   multivitamin with minerals tablet 1 tablet   LORazepam (ATIVAN) tablet 1 mg   hydrOXYzine (ATARAX) tablet 25 mg   loperamide (IMODIUM) capsule 2-4 mg   PTA Medications  Medication Sig   gabapentin (NEURONTIN) 800 MG tablet Take 800 mg by mouth 3 (three) times daily.   buPROPion (WELLBUTRIN XL) 300 MG 24 hr tablet Take 300 mg by mouth daily.   QUEtiapine (SEROQUEL) 100 MG tablet Take 100 mg by mouth at bedtime.    Long Term Goals: Improvement in symptoms so as ready for discharge  Short Term Goals: Patient will verbalize feelings in meetings with treatment team members., Patient will attend at least of 50% of the groups daily., Pt will complete the PHQ9 on admission, day 3 and discharge., Patient will participate in completing the [place] Suicide Severity Rating Scale, Patient will score a low risk of violence for 24 hours prior to discharge, and Patient will take medications as prescribed daily.  Medical Decision Making  Wesley Lee was admitted to [facility] base crisis unit under the service of Nelly Rout, MD for Substance induced mood disorder [facility]), crisis management, and stabilization. Routine labs reviewed which include Lab Orders         CBC with Differential/Platelet         Comprehensive metabolic panel         Hemoglobin A1c         TSH         Ethanol         Lipid panel         Urinalysis, Routine w reflex microscopic -Urine, Clean Catch         POCT Urine Drug Screen - (I-Screen)    Medication Management: Medications started Meds ordered this encounter  Medications   acetaminophen (TYLENOL) tablet 650 mg   alum & mag hydroxide-simeth (MAALOX/MYLANTA) 200-200-20 MG/5ML suspension 30 mL   magnesium hydroxide (MILK OF MAGNESIA) suspension 30 mL   traZODone (DESYREL) tablet 50 mg   DISCONTD:  dicyclomine (BENTYL) tablet 20 mg   DISCONTD: hydrOXYzine (ATARAX) tablet 25 mg   DISCONTD: loperamide (IMODIUM) capsule 2-4 mg   methocarbamol (ROBAXIN) tablet  500 mg   DISCONTD: naproxen (NAPROSYN) tablet 500 mg   ondansetron (ZOFRAN-ODT) disintegrating tablet 4 mg   DISCONTD: gabapentin (NEURONTIN) capsule 300 mg   buPROPion (WELLBUTRIN XL) 24 hr tablet 300 mg   DISCONTD: OLANZapine zydis (ZYPREXA) disintegrating tablet 15 mg   QUEtiapine (SEROQUEL) tablet 100 mg   QUEtiapine (SEROQUEL) tablet 100 mg   buPROPion (WELLBUTRIN XL) 24 hr tablet 300 mg   gabapentin (NEURONTIN) capsule 800 mg   thiamine (VITAMIN B1) injection 100 mg   thiamine (VITAMIN B1) tablet 100 mg   multivitamin with minerals tablet 1 tablet   LORazepam (ATIVAN) tablet 1 mg   hydrOXYzine (ATARAX) tablet 25 mg   loperamide (IMODIUM) capsule 2-4 mg   Will maintain observation checks every 15 minutes for safety. Psychosocial education regarding relapse prevention and self-care; social and communication  Social work will consult with patient to discuss discharge and follow up plan.  Recommendations  Based on my evaluation the patient does not appear to have an emergency medical condition.  Wesley Antone, NP 02/03/23  2:01 PM

## 2023-02-01 NOTE — ED Notes (Signed)
No sxs of distress noted - will continue to observe for safety

## 2023-02-01 NOTE — ED Notes (Signed)
Pt sleeping@this time. Breathing even and unlabored. Will continue to monitor for safety 

## 2023-02-01 NOTE — ED Provider Notes (Incomplete)
[facility] Continuous Assessment Admission H&P  Date: 01/31/23 Patient Name: Estol Garris MRN: [id number] Chief Complaint: "I was sent  Diagnoses:  Final diagnoses:  Substance induced mood disorder [facility])   HPI:  Jaun Hindmarsh 42 y.o., male patient presented to [facility] as a walk in , voluntarily, alone, transported to [facility] via cab.  with complaints of ***  [name], 42 y.o., male patient seen face to face by this provider, consulted with Dr. Lucianne Muss; and chart reviewed on 01/31/23.    On evaluation Hayato Levi reports that he is here at [facility] for urgent care for detox.  Patient reports that he last used methamphetamines 3 days ago and want to go into long-term treatment facility at [facility] however was advised to come here to detox.  On evaluation patient is severely manic unable to remain still, unable to pick simple direction to remain in the room and to sit still.  Patient is constantly up running around the hallway and also his room and requires multiple attempts at redirection before patient will actively listened and engage in the conversation.  Patient reports that he was diagnosed bipolar disorder but he does not believe he has however patient reports to me that he takes the following medications: Gabapentin 300 mg 3 times daily and this is for foot neuropathy, BuSpar 15 mg twice a day for anxiety, "100 mg at bedtime, patient is unable to tell this writer needs the last time she seen a psychiatric provider however endorses that she is currently being linked up with [facility] services closer to home in [place].  However patient does not meet inpatient criteria for safety patient is manic unable to respect boundaries constantly getting in the face of other staff members trying to elope from the facility speaking inappropriately threatening to kill others and harm herself.  Patient meets IVC criteria.  During evaluation Bram Hasser is  sitting, in no acute distress.  She is alert, oriented x 3, anxious, calm, cooperative and attentive.  ***His/Her mood is ***euthymic with congruent affect.  ***He/She has normal speech, and behavior.  Objectively there is no evidence of psychosis/mania or delusional thinking.  Patient is able to converse coherently, goal directed thoughts, no distractibility, or pre-occupation.  ***He/She also denies suicidal/self-harm/homicidal ideation, psychosis, and paranoia.  Patient answered question appropriately.      Total Time spent with patient: 45 minutes  Psychiatric Specialty Exam  Presentation General Appearance:  Appropriate for Environment  Eye Contact: Fleeting  Speech: Garbled  Speech Volume: Normal  Handedness: Right   Mood and Affect  Mood: Euphoric  Affect: Appropriate   Thought Process  Thought Processes:No data recorded Descriptions of Associations:Circumstantial  Orientation:Full (Time, Place and Person)  Thought Content:Paranoid Ideation  Diagnosis of Schizophrenia or Schizoaffective disorder in past: No  Duration of Psychotic Symptoms: Less than six months  Hallucinations:Hallucinations: None  Ideas of Reference:None  Suicidal Thoughts:Suicidal Thoughts: No  Homicidal Thoughts:Homicidal Thoughts: No   Sensorium  Memory: Immediate Good; Recent Good  Judgment: Impaired  Insight: Lacking   Executive Functions  Concentration: Poor  Attention Span: Poor  Recall: [employer] of Knowledge: Fair  Language: Fair   Psychomotor Activity  Psychomotor Activity: Psychomotor Activity: Restlessness   Assets  Assets: Physical Health; Desire for Improvement; Resilience   Sleep  Sleep: Sleep: Poor   No data recorded  Physical Exam ROS  Blood pressure 137/87, pulse 72, temperature 98 F (36.7 C), temperature source Oral, resp. rate 18,  SpO2 98 %. There is no height or weight on file to calculate BMI.  Past Psychiatric History:  ***   Is the patient at risk to self? No  Has the patient been a risk to self in the past 6 months? No .    Has the patient been a risk to self within the distant past? No     Past Medical History: ***  Family History: ***  Social History: ***    Allergies: Patient has no allergy information on record.  Medications:  Facility Ordered Medications  Medication  . acetaminophen (TYLENOL) tablet 650 mg  . alum & mag hydroxide-simeth (MAALOX/MYLANTA) 200-200-20 MG/5ML suspension 30 mL  . magnesium hydroxide (MILK OF MAGNESIA) suspension 30 mL  . traZODone (DESYREL) tablet 50 mg  . dicyclomine (BENTYL) tablet 20 mg  . hydrOXYzine (ATARAX) tablet 25 mg  . loperamide (IMODIUM) capsule 2-4 mg  . methocarbamol (ROBAXIN) tablet 500 mg  . naproxen (NAPROSYN) tablet 500 mg  . ondansetron (ZOFRAN-ODT) disintegrating tablet 4 mg  . gabapentin (NEURONTIN) capsule 300 mg  . buPROPion (WELLBUTRIN XL) 24 hr tablet 300 mg  . OLANZapine zydis (ZYPREXA) disintegrating tablet 15 mg      Medical Decision Making  ***   Patient at present is refusing labs appears manic and paranoid,  Seroquel is on hold, starting Olanzapine.   Recommendations  {[facility] MSE Recommendations:304701}  Joaquin Courts, NP 01/31/23  5:25 PM

## 2023-02-01 NOTE — ED Notes (Signed)
Patient accepted scheduled meds w/o difficulty. Responded appropriately to questions from staff when asked. Breakfast provided. Safety maintained and will continue to monitor.

## 2023-02-01 NOTE — ED Notes (Signed)
Patient is resting in bed with no sxs of distress noted - will continue to observe for safety 

## 2023-02-01 NOTE — ED Notes (Signed)
Snacks given 

## 2023-02-01 NOTE — ED Notes (Signed)
Pt is alert and oriented. Pt has flat affect that brightens upon engagement.  Mood is congruent.  Pt denies SI, HI or AVH.  Reports that he is attempting to detox off of Methamphetamines.  Pt brought to [facility] by [facility] RN from [facility].  Gait even and steady. Pt oriented to rules of FBC , room and milieu.  He was given dinner and is currently watching tv with peers.

## 2023-02-01 NOTE — ED Notes (Signed)
Did not attend AA Group meeting

## 2023-02-02 DIAGNOSIS — F1514 Other stimulant abuse with stimulant-induced mood disorder: Secondary | ICD-10-CM | POA: Diagnosis not present

## 2023-02-02 DIAGNOSIS — F22 Delusional disorders: Secondary | ICD-10-CM | POA: Diagnosis not present

## 2023-02-02 DIAGNOSIS — R450 Nervousness: Secondary | ICD-10-CM | POA: Diagnosis not present

## 2023-02-02 DIAGNOSIS — F1994 Other psychoactive substance use, unspecified with psychoactive substance-induced mood disorder: Secondary | ICD-10-CM | POA: Diagnosis not present

## 2023-02-02 MED ORDER — THIAMINE MONONITRATE 100 MG PO TABS
100.0000 mg | ORAL_TABLET | Freq: Every day | ORAL | Status: DC
  Administered 2023-02-03 – 2023-02-09 (×7): 100 mg via ORAL
  Filled 2023-02-02 (×8): qty 1

## 2023-02-02 MED ORDER — GABAPENTIN 400 MG PO CAPS
800.0000 mg | ORAL_CAPSULE | Freq: Three times a day (TID) | ORAL | Status: DC
  Administered 2023-02-02 – 2023-02-10 (×24): 800 mg via ORAL
  Filled 2023-02-02 (×5): qty 2
  Filled 2023-02-02: qty 84
  Filled 2023-02-02 (×15): qty 2
  Filled 2023-02-02: qty 1
  Filled 2023-02-02 (×4): qty 2

## 2023-02-02 MED ORDER — LOPERAMIDE HCL 2 MG PO CAPS: 2.0000 mg | ORAL_CAPSULE | ORAL | Status: AC | PRN

## 2023-02-02 MED ORDER — LORAZEPAM 1 MG PO TABS: 1.0000 mg | ORAL_TABLET | Freq: Four times a day (QID) | ORAL | Status: AC | PRN

## 2023-02-02 MED ORDER — HYDROXYZINE HCL 25 MG PO TABS
25.0000 mg | ORAL_TABLET | Freq: Four times a day (QID) | ORAL | Status: DC | PRN
  Administered 2023-02-03: 25 mg via ORAL
  Filled 2023-02-02: qty 1

## 2023-02-02 MED ORDER — ADULT MULTIVITAMIN W/MINERALS CH
1.0000 | ORAL_TABLET | Freq: Every day | ORAL | Status: DC
  Administered 2023-02-02 – 2023-02-09 (×8): 1 via ORAL
  Filled 2023-02-02 (×9): qty 1

## 2023-02-02 MED ORDER — THIAMINE HCL 100 MG/ML IJ SOLN: 100.0000 mg | Freq: Once | INTRAMUSCULAR | Status: DC

## 2023-02-02 MED ORDER — BUPROPION HCL ER (XL) 300 MG PO TB24
300.0000 mg | ORAL_TABLET | Freq: Every day | ORAL | Status: DC
  Administered 2023-02-02 – 2023-02-10 (×9): 300 mg via ORAL
  Filled 2023-02-02: qty 14
  Filled 2023-02-02 (×9): qty 1

## 2023-02-02 NOTE — ED Notes (Signed)
Patient resting with no sxs of distress noted - will continue to monitor for safety 

## 2023-02-02 NOTE — Group Note (Signed)
Group Topic: Recovery Basics  Group Date: 02/02/2023 Start Time: 1300 End Time: 1330 Facilitators: Jenean Lindau, RN  Department: [facility]  Number of Participants: 8  Group Focus: chemical dependency education Treatment Modality:  Psychoeducation Interventions utilized were patient education Purpose: increase insight  Name: Wesley Lee Date of Birth: [birth year]  MR: [id number]    Level of Participation: minimal Quality of Participation: attentive Interactions with others: gave feedback Mood/Affect: appropriate Triggers (if applicable):   Cognition: concrete Progress: Gaining insight Response:   Plan: follow-up needed  Patients Problems:  Patient Active Problem List   Diagnosis Date Noted   Substance induced mood disorder (HCC) 02/01/2023   Polysubstance abuse (HCC) 02/01/2023

## 2023-02-02 NOTE — ED Notes (Signed)
Pt had dinenr

## 2023-02-02 NOTE — ED Notes (Signed)
Pt did not attend group. 

## 2023-02-02 NOTE — ED Provider Notes (Signed)
Behavioral Health Progress Note  Date and Time: 02/02/2023 4:59 PM Name: Chasten Crompton MRN:  [id number]  Subjective:  Wesley Lee 42 y/o, male patient presented to [facility] as a walk in voluntarily requesting assistance with meth and alcohol detox and treatment.  Patient was admitted to [facility] for detox and residential rehab placement  Patient states he has been feeling tired.  He denies any withdrawal symptoms or cravings.  He reports that he has been using meth daily 3-7 times in a week.  He denies any issues with sleep and appetite.  He drinks alcohol couple of beers a day.  He denies any history of withdrawal seizures and DTs.  He uses cocaine rarely.  He denies any blackout episodes.  He was admitted for 6 days in [place] for detox 5 years ago.  He has never been to any residential rehab.  He is divorced and homeless and was staying at a hotel before.  He has 2 daughters (1 lives in [place] and other lives in [place]).  He is unemployed currently.  He reports that he has been taking gabapentin 800 mg 3 times daily for shoulder nerve damage and restless leg syndrome.  He reports that he is still having pain and rates at 5/10 on a scale of 0-10 where 10 being severe pain.  He rates his depression at 2/10 and anxiety at 0/10 on a scale of 0-10 with 10 being severe symptoms.  He has been taking Wellbutrin XL 300 mg daily for many years for ADD.  He used to take Ritalin for  ADHD when he was a child. He reports that somebody diagnosed him with bipolar in the past but he does not agree with bipolar diagnosis.  He was not using drugs at that time.  He denies having manic type episodes in the absence of drug use.  he went to halfway house ([facility]) but was told that he needed detox first.  He is interested in long-term rehab placement but will call halfway house again tomorrow for follow-up. He denies SI, HI, AVH and paranoia. Saw patient again with Dr. Gasper Sells.  Discussed to restart home dose of  gabapentin and Wellbutrin.  Patient agrees with the plan. Diagnosis:  Final diagnoses:  Substance induced mood disorder (HCC)  Polysubstance abuse (HCC)    Total Time spent with patient: 30 minutes   Past Psychiatric History:      Patient Active Problem List    Diagnosis Date Noted   Substance induced mood disorder (HCC) 02/01/2023   Polysubstance abuse (HCC) 02/01/2023        Past Medical History: History reviewed. No pertinent past medical history.   None reported Family History: History reviewed. No pertinent family history.   None reported Social History: Polysubstance abuse, currently living in half way house   Additional Social History:    Pain Medications: See MAR Prescriptions: See MAR Over the Counter: See MAR History of alcohol / drug use?: Yes Longest period of sobriety (when/how long): Unknown UTA due patient AMS Negative Consequences of Use:  (UTA) Withdrawal Symptoms:  (UTA) Name of Substance 1: Methamphetamines 1 - Age of First Use: UTA due to patient being actively impaired at the time of assessment 1 - Amount (size/oz): UTA due to patient being actively impaired at the time of assessment 1 - Frequency: UTA due to patient being actively impaired at the time of assessment 1 - Duration: UTA due to patient being actively impaired at the time of assessment  1 - Last Use / Amount: patient per notes reported 3 days ago although per observation patient is currently impaired 1 - Method of Aquiring: illegally 1- Route of Use: UTA                  Sleep: Good  Appetite:  Good  Current Medications:  Current Facility-Administered Medications  Medication Dose Route Frequency Provider Last Rate Last Admin   acetaminophen (TYLENOL) tablet 650 mg  650 mg Oral Q6H PRN Bing Neighbors, NP       alum & mag hydroxide-simeth (MAALOX/MYLANTA) 200-200-20 MG/5ML suspension 30 mL  30 mL Oral Q4H PRN Bing Neighbors, NP       buPROPion (WELLBUTRIN XL) 24 hr tablet  300 mg  300 mg Oral Once Bing Neighbors, NP       buPROPion (WELLBUTRIN XL) 24 hr tablet 300 mg  300 mg Oral Daily Karsten Ro, MD   300 mg at 02/02/23 1552   gabapentin (NEURONTIN) capsule 800 mg  800 mg Oral TID Karsten Ro, MD   800 mg at 02/02/23 1552   hydrOXYzine (ATARAX) tablet 25 mg  25 mg Oral Q6H PRN Karsten Ro, MD       loperamide (IMODIUM) capsule 2-4 mg  2-4 mg Oral PRN Karsten Ro, MD       LORazepam (ATIVAN) tablet 1 mg  1 mg Oral Q6H PRN Karsten Ro, MD       magnesium hydroxide (MILK OF MAGNESIA) suspension 30 mL  30 mL Oral Daily PRN Bing Neighbors, NP       methocarbamol (ROBAXIN) tablet 500 mg  500 mg Oral Q8H PRN Bing Neighbors, NP       multivitamin with minerals tablet 1 tablet  1 tablet Oral Daily Stepanie Graver, MD       ondansetron (ZOFRAN-ODT) disintegrating tablet 4 mg  4 mg Oral Q6H PRN Bing Neighbors, NP       thiamine (VITAMIN B1) injection 100 mg  100 mg Intramuscular Once Karsten Ro, MD       [START ON 02/03/2023] thiamine (VITAMIN B1) tablet 100 mg  100 mg Oral Daily Amelya Mabry, MD       traZODone (DESYREL) tablet 50 mg  50 mg Oral QHS PRN Bing Neighbors, NP   50 mg at 02/01/23 2111   Current Outpatient Medications  Medication Sig Dispense Refill   buPROPion (WELLBUTRIN XL) 300 MG 24 hr tablet Take 300 mg by mouth daily.     gabapentin (NEURONTIN) 800 MG tablet Take 800 mg by mouth 3 (three) times daily.     QUEtiapine (SEROQUEL) 100 MG tablet Take 100 mg by mouth at bedtime.      Labs  Lab Results:  Admission on 01/31/2023  Component Date Value Ref Range Status   Color, Urine 01/31/2023 YELLOW  YELLOW Final   APPearance 01/31/2023 CLEAR  CLEAR Final   Specific Gravity, Urine 01/31/2023 1.006  1.005 - 1.030 Final   pH 01/31/2023 6.0  5.0 - 8.0 Final   Glucose, UA 01/31/2023 NEGATIVE  NEGATIVE mg/dL Final   Hgb urine dipstick 01/31/2023 SMALL (A)  NEGATIVE Final   Bilirubin Urine 01/31/2023 NEGATIVE  NEGATIVE Final    Ketones, ur 01/31/2023 NEGATIVE  NEGATIVE mg/dL Final   Protein, ur 16/07/9603 NEGATIVE  NEGATIVE mg/dL Final   Nitrite 54/05/8118 NEGATIVE  NEGATIVE Final   Leukocytes,Ua 01/31/2023 NEGATIVE  NEGATIVE Final   RBC / HPF 01/31/2023 0-5  0 - 5 RBC/hpf  Final   WBC, UA 01/31/2023 0-5  0 - 5 WBC/hpf Final   Bacteria, UA 01/31/2023 NONE SEEN  NONE SEEN Final   Squamous Epithelial / HPF 01/31/2023 0-5  0 - 5 /HPF Final   Performed at [facility], [address], [place], [place] [address]   POC Amphetamine UR 01/31/2023 Positive (A)  NONE DETECTED (Cut Off Level 1000 ng/mL) Final   POC Secobarbital (BAR) 01/31/2023 None Detected  NONE DETECTED (Cut Off Level 300 ng/mL) Final   POC Buprenorphine (BUP) 01/31/2023 None Detected  NONE DETECTED (Cut Off Level 10 ng/mL) Final   POC Oxazepam (BZO) 01/31/2023 Positive (A)  NONE DETECTED (Cut Off Level 300 ng/mL) Final   POC Cocaine UR 01/31/2023 None Detected  NONE DETECTED (Cut Off Level 300 ng/mL) Final   POC Methamphetamine UR 01/31/2023 Positive (A)  NONE DETECTED (Cut Off Level 1000 ng/mL) Final   POC Morphine 01/31/2023 None Detected  NONE DETECTED (Cut Off Level 300 ng/mL) Final   POC Methadone UR 01/31/2023 None Detected  NONE DETECTED (Cut Off Level 300 ng/mL) Final   POC Oxycodone UR 01/31/2023 None Detected  NONE DETECTED (Cut Off Level 100 ng/mL) Final   POC Marijuana UR 01/31/2023 None Detected  NONE DETECTED (Cut Off Level 50 ng/mL) Final    Blood Alcohol level:  No results found for: "ETH"  Metabolic Disorder Labs: No results found for: "HGBA1C", "MPG" No results found for: "PROLACTIN" No results found for: "CHOL", "TRIG", "HDL", "CHOLHDL", "VLDL", "LDLCALC"  Therapeutic Lab Levels: No results found for: "LITHIUM" No results found for: "VALPROATE" No results found for: "CBMZ"  Physical Findings   Flowsheet Row ED from 01/31/2023 in [facility]  C-SSRS RISK CATEGORY No Risk         Musculoskeletal  Strength & Muscle Tone: within normal limits Gait & Station: normal Patient leans: N/A  Psychiatric Specialty Exam  Presentation  General Appearance:  Appropriate for Environment  Eye Contact: Fair  Speech: Clear and Coherent; Normal Rate  Speech Volume: Normal  Handedness: Right   Mood and Affect  Mood: Euthymic  Affect: Congruent   Thought Process  Thought Processes:Coherent  Descriptions of Associations:Intact  Orientation:Full (Time, Place and Person)  Thought Content:Logical  Diagnosis of Schizophrenia or Schizoaffective disorder in past: No    Hallucinations:Hallucinations: None  Ideas of Reference:None  Suicidal Thoughts:Suicidal Thoughts: No  Homicidal Thoughts:Homicidal Thoughts: No   Sensorium  Memory: -- (Grossly intact)  Judgment: Fair  Insight: Fair   Art therapist  Concentration: Fair  Attention Span: Fair  Recall: -- (N/A)  Fund of Knowledge: Fair  Language: Fair   Psychomotor Activity  Psychomotor Activity:Psychomotor Activity: Normal   Assets  Assets: Desire for Improvement; Communication Skills; Resilience   Sleep  Sleep:Sleep: Good    Physical Exam  Physical Exam Vitals and nursing note reviewed.  HENT:     Head: Normocephalic.  Pulmonary:     Effort: Pulmonary effort is normal.    Review of Systems  Constitutional:  Positive for malaise/fatigue.  Respiratory:  Negative for cough.   Cardiovascular:  Negative for chest pain.  Gastrointestinal:  Negative for abdominal pain, nausea and vomiting.  Neurological:  Negative for dizziness and headaches.  Psychiatric/Behavioral:  Positive for substance abuse. Negative for depression, hallucinations and suicidal ideas. The patient is not nervous/anxious.    Blood pressure (!) 140/75, pulse 75, temperature (!) 97.5 F (36.4 C), temperature source Oral, resp. rate 18, SpO2 98 %. There is no  height or weight on file to  calculate BMI.  Treatment Plan Summary:Findley Brave 42 y/o, male patient presented to [facility] as a walk in voluntarily requesting assistance with meth and alcohol detox and treatment.  Patient was admitted to [facility] for detox and residential rehab placement Patient went to halfway house but was told to get detox first.  He was started on COWS and PRN's at [facility] but patient denies using any opiates.  His last COWS was 1 and he has not used any other PRN medications other than hydroxyzine and trazodone. Will discontinue COWS and keep on monitoring his symptoms.  Will Start CIWA. Patient is reporting history of bipolar but denies having any manic episodes in the absence of drug use.  Currently denies depression, anxiety and manic symptoms.  Will keep on monitoring symptoms and need for mood stabilizer.  Daily contact with patient to assess and evaluate symptoms and progress in treatment Labs- UDS-positive for amphetamine, oxazepam, meth -Urine positive for small blood Send labs-CBC, CMP, lipid panel, TSH, HbA1c ordered at [facility]-needs to be collected.   Methamphetamine abuse Cocaine use -Patient was started on COWS and PRN's at [facility] but patient denies using any opiates.  His last COWS was 1 and he has not used any other PRN medications other than hydroxyzine and trazodone. -Will discontinue COWS and PRN's.  -Interested in rehab.  Patient will call halfway house tomorrow. -Will consider starting naltrexone.  -Continue group therapy  Alcohol use - Start CIWA with PRN Ativan for CIWA >10.  - Continue PRN Imodium, Zofran, Hydroxyzine, Mylanta, MOM - Start Vitamin B1 and multivitamin  Reported h/o Bipolar  Denies manic episodes in the absence of drug use.  Currently, denies depression, anxiety or manic symptoms.  Denies SI, HI, AVH. -Monitor symptoms and need for mood stabilizer. -Continue PRN's hydroxyzine, trazodone.  Nerve damage in shoulder -Increase gabapentin to home dose 800 mg 3 times  daily  ADD No h/o withdrawal seizures.  -Restart home Wellbutrin XL 300 mg daily  Dispo-TBD.  Karsten Ro, MD 02/02/2023 4:59 PM

## 2023-02-02 NOTE — ED Notes (Signed)
Patient refused thiamine injection.

## 2023-02-02 NOTE — ED Notes (Signed)
Pt had breakfast  °

## 2023-02-02 NOTE — ED Notes (Signed)
Patient is awake and alert on unit.  He is social with select peers.  Observed playing cards in the dayroom.  No distress or somatic complaint.

## 2023-02-02 NOTE — ED Notes (Signed)
Patient remains asleep in bed without issue or complaint.  Will monitor and provide a safe environment.

## 2023-02-02 NOTE — ED Notes (Signed)
Pt had lunch 

## 2023-02-02 NOTE — ED Notes (Signed)
Patient is resting quietly in room with eyes closes. Respirations even and unlabored will continue to monitor for safety.

## 2023-02-02 NOTE — ED Notes (Signed)
Patient continues to rest with no sxs of distress noted - will continue to monitor for safety 

## 2023-02-02 NOTE — ED Notes (Signed)
RN informed patient that blood work was needed and he refused stating paranoid ideas about the blood draw process.

## 2023-02-03 DIAGNOSIS — R450 Nervousness: Secondary | ICD-10-CM | POA: Diagnosis not present

## 2023-02-03 DIAGNOSIS — F1514 Other stimulant abuse with stimulant-induced mood disorder: Secondary | ICD-10-CM | POA: Diagnosis not present

## 2023-02-03 DIAGNOSIS — F22 Delusional disorders: Secondary | ICD-10-CM | POA: Diagnosis not present

## 2023-02-03 DIAGNOSIS — F1994 Other psychoactive substance use, unspecified with psychoactive substance-induced mood disorder: Secondary | ICD-10-CM | POA: Diagnosis not present

## 2023-02-03 NOTE — ED Notes (Signed)
Patient was provided with lunch 

## 2023-02-03 NOTE — ED Notes (Signed)
Patient was provided with dinner 

## 2023-02-03 NOTE — ED Provider Notes (Signed)
Behavioral Health Progress Note  Date and Time: 02/03/2023 2:54 PM Name: Wesley Lee MRN:  [id number]  Subjective:  Wesley Lee 42 y/o, male patient presented to [facility] as a walk in voluntarily requesting assistance with meth and alcohol detox and treatment.  Patient was admitted to [facility] for detox and residential rehab placement  02/02/2023 by Dr. Leone Haven "Patient states he has been feeling tired.  He denies any withdrawal symptoms or cravings.  He reports that he has been using meth daily 3-7 times in a week.  He denies any issues with sleep and appetite.  He drinks alcohol couple of beers a day.  He denies any history of withdrawal seizures and DTs.  He uses cocaine rarely.  He denies any blackout episodes.  He was admitted for 6 days in [place] for detox 5 years ago.  He has never been to any residential rehab.  He is divorced and homeless and was staying at a hotel before.  He has 2 daughters (1 lives in [place] and other lives in [place]).  He is unemployed currently.  He reports that he has been taking gabapentin 800 mg 3 times daily for shoulder nerve damage and restless leg syndrome.  He reports that he is still having pain and rates at 5/10 on a scale of 0-10 where 10 being severe pain.  He rates his depression at 2/10 and anxiety at 0/10 on a scale of 0-10 with 10 being severe symptoms.  He has been taking Wellbutrin XL 300 mg daily for many years for ADD.  He used to take Ritalin for  ADHD when he was a child. He reports that somebody diagnosed him with bipolar in the past but he does not agree with bipolar diagnosis.  He was not using drugs at that time.  He denies having manic type episodes in the absence of drug use.  [facility] but was told that he needed detox first.  He is interested in long-term rehab placement but will call halfway house again tomorrow for follow-up. He denies SI, HI, AVH and paranoia."  Wesley Lee, 42 y.o., male patient seen face to  face by this provider, consulted with Dr. Lucianne Muss; and chart reviewed on 02/03/23.  Patient has been just [facility].  He had only been in the program for 1 day before leaving and presenting to [facility] in [place].  [facility] and asked for referred him to [facility] for detox.  He continues to seek residential substance abuse treatment.  UDS is positive for amphetamines, benzodiazepines, and methamphetamines.  EtOH was not ordered.He denies any withdrawal symptoms.  He is tolerating the gabapentin and Wellbutrin without any adverse reactions.  Most recent CIWA and COWS score is 0.  During evaluation Wesley Lee is observed laying in his bed.  He is disheveled and makes good eye contact.  He is alert/oriented x 4, cooperative, and attentive.  When asked if he is experiencing any depression he states "I am okay".  He has a dysphoric affect.  He continues to deny SI/HI/AVH. Objectively there is no evidence of psychosis/mania or delusional thinking.  Patient is able to converse coherently, goal directed thoughts, no distractibility, or pre-occupation.   Patient answered question appropriately.       Diagnosis:  Final diagnoses:  Substance induced mood disorder (HCC)  Polysubstance abuse (HCC)    Total Time spent with patient: 30 minutes   Past Psychiatric History:  Patient Active Problem List    Diagnosis Date Noted   Substance induced mood disorder (HCC) 02/01/2023   Polysubstance abuse (HCC) 02/01/2023        Past Medical History: History reviewed. No pertinent past medical history.   None reported Family History: History reviewed. No pertinent family history.   None reported Social History: Polysubstance abuse, currently living in half way house   Additional Social History:    Pain Medications: See MAR Prescriptions: See MAR Over the Counter: See MAR History of alcohol / drug use?: Yes Longest period of sobriety (when/how long): Unknown UTA  due patient AMS Negative Consequences of Use:  (UTA) Withdrawal Symptoms:  (UTA) Name of Substance 1: Methamphetamines 1 - Age of First Use: UTA due to patient being actively impaired at the time of assessment 1 - Amount (size/oz): UTA due to patient being actively impaired at the time of assessment 1 - Frequency: UTA due to patient being actively impaired at the time of assessment 1 - Duration: UTA due to patient being actively impaired at the time of assessment 1 - Last Use / Amount: patient per notes reported 3 days ago although per observation patient is currently impaired 1 - Method of Aquiring: illegally 1- Route of Use: UTA                  Sleep: Good  Appetite:  Good  Current Medications:  Current Facility-Administered Medications  Medication Dose Route Frequency Provider Last Rate Last Admin   acetaminophen (TYLENOL) tablet 650 mg  650 mg Oral Q6H PRN Bing Neighbors, NP       alum & mag hydroxide-simeth (MAALOX/MYLANTA) 200-200-20 MG/5ML suspension 30 mL  30 mL Oral Q4H PRN Bing Neighbors, NP       buPROPion (WELLBUTRIN XL) 24 hr tablet 300 mg  300 mg Oral Once Bing Neighbors, NP       buPROPion (WELLBUTRIN XL) 24 hr tablet 300 mg  300 mg Oral Daily Karsten Ro, MD   300 mg at 02/03/23 4098   gabapentin (NEURONTIN) capsule 800 mg  800 mg Oral TID Karsten Ro, MD   800 mg at 02/03/23 1191   hydrOXYzine (ATARAX) tablet 25 mg  25 mg Oral Q6H PRN Karsten Ro, MD       loperamide (IMODIUM) capsule 2-4 mg  2-4 mg Oral PRN Karsten Ro, MD       LORazepam (ATIVAN) tablet 1 mg  1 mg Oral Q6H PRN Karsten Ro, MD       magnesium hydroxide (MILK OF MAGNESIA) suspension 30 mL  30 mL Oral Daily PRN Bing Neighbors, NP       methocarbamol (ROBAXIN) tablet 500 mg  500 mg Oral Q8H PRN Bing Neighbors, NP       multivitamin with minerals tablet 1 tablet  1 tablet Oral Daily Karsten Ro, MD   1 tablet at 02/03/23 0918   ondansetron (ZOFRAN-ODT)  disintegrating tablet 4 mg  4 mg Oral Q6H PRN Bing Neighbors, NP       thiamine (VITAMIN B1) injection 100 mg  100 mg Intramuscular Once Karsten Ro, MD       thiamine (VITAMIN B1) tablet 100 mg  100 mg Oral Daily Karsten Ro, MD   100 mg at 02/03/23 0918   traZODone (DESYREL) tablet 50 mg  50 mg Oral QHS PRN Bing Neighbors, NP   50 mg at 02/02/23 2118   Current Outpatient Medications  Medication Sig Dispense Refill  buPROPion (WELLBUTRIN XL) 300 MG 24 hr tablet Take 300 mg by mouth daily.     gabapentin (NEURONTIN) 800 MG tablet Take 800 mg by mouth 3 (three) times daily.     QUEtiapine (SEROQUEL) 100 MG tablet Take 100 mg by mouth at bedtime.      Labs  Lab Results:  Admission on 01/31/2023  Component Date Value Ref Range Status   Color, Urine 01/31/2023 YELLOW  YELLOW Final   APPearance 01/31/2023 CLEAR  CLEAR Final   Specific Gravity, Urine 01/31/2023 1.006  1.005 - 1.030 Final   pH 01/31/2023 6.0  5.0 - 8.0 Final   Glucose, UA 01/31/2023 NEGATIVE  NEGATIVE mg/dL Final   Hgb urine dipstick 01/31/2023 SMALL (A)  NEGATIVE Final   Bilirubin Urine 01/31/2023 NEGATIVE  NEGATIVE Final   Ketones, ur 01/31/2023 NEGATIVE  NEGATIVE mg/dL Final   Protein, ur 13/04/6577 NEGATIVE  NEGATIVE mg/dL Final   Nitrite 46/96/2952 NEGATIVE  NEGATIVE Final   Leukocytes,Ua 01/31/2023 NEGATIVE  NEGATIVE Final   RBC / HPF 01/31/2023 0-5  0 - 5 RBC/hpf Final   WBC, UA 01/31/2023 0-5  0 - 5 WBC/hpf Final   Bacteria, UA 01/31/2023 NONE SEEN  NONE SEEN Final   Squamous Epithelial / HPF 01/31/2023 0-5  0 - 5 /HPF Final   Performed at [facility], [address] [place], [place] [address]   POC Amphetamine UR 01/31/2023 Positive (A)  NONE DETECTED (Cut Off Level 1000 ng/mL) Final   POC Secobarbital (BAR) 01/31/2023 None Detected  NONE DETECTED (Cut Off Level 300 ng/mL) Final   POC Buprenorphine (BUP) 01/31/2023 None Detected  NONE DETECTED (Cut Off Level 10 ng/mL) Final   POC Oxazepam  (BZO) 01/31/2023 Positive (A)  NONE DETECTED (Cut Off Level 300 ng/mL) Final   POC Cocaine UR 01/31/2023 None Detected  NONE DETECTED (Cut Off Level 300 ng/mL) Final   POC Methamphetamine UR 01/31/2023 Positive (A)  NONE DETECTED (Cut Off Level 1000 ng/mL) Final   POC Morphine 01/31/2023 None Detected  NONE DETECTED (Cut Off Level 300 ng/mL) Final   POC Methadone UR 01/31/2023 None Detected  NONE DETECTED (Cut Off Level 300 ng/mL) Final   POC Oxycodone UR 01/31/2023 None Detected  NONE DETECTED (Cut Off Level 100 ng/mL) Final   POC Marijuana UR 01/31/2023 None Detected  NONE DETECTED (Cut Off Level 50 ng/mL) Final    Blood Alcohol level:  No results found for: "ETH"  Metabolic Disorder Labs: No results found for: "HGBA1C", "MPG" No results found for: "PROLACTIN" No results found for: "CHOL", "TRIG", "HDL", "CHOLHDL", "VLDL", "LDLCALC"  Therapeutic Lab Levels: No results found for: "LITHIUM" No results found for: "VALPROATE" No results found for: "CBMZ"  Physical Findings   PHQ2-9    Flowsheet Row ED from 01/31/2023 in [facility]  PHQ-2 Total Score 0      Flowsheet Row ED from 01/31/2023 in [facility]  C-SSRS RISK CATEGORY No Risk        Musculoskeletal  Strength & Muscle Tone: within normal limits Gait & Station: normal Patient leans: N/A  Psychiatric Specialty Exam  Presentation  General Appearance:  Appropriate for Environment  Eye Contact: Fair  Speech: Clear and Coherent; Normal Rate  Speech Volume: Normal  Handedness: Right   Mood and Affect  Mood: Euthymic  Affect: Congruent   Thought Process  Thought Processes:Coherent  Descriptions of Associations:Intact  Orientation:Full (Time, Place and Person)  Thought Content:Logical  Diagnosis of Schizophrenia  or Schizoaffective disorder in past: No    Hallucinations:Hallucinations: None  Ideas of Reference:None  Suicidal  Thoughts:Suicidal Thoughts: No  Homicidal Thoughts:Homicidal Thoughts: No   Sensorium  Memory: -- (Grossly intact)  Judgment: Fair  Insight: Fair   Art therapist  Concentration: Fair  Attention Span: Fair  Recall: -- (N/A)  Fund of Knowledge: Fair  Language: Fair   Psychomotor Activity  Psychomotor Activity:Psychomotor Activity: Normal   Assets  Assets: Desire for Improvement; Communication Skills; Resilience   Sleep  Sleep:Sleep: Good    Physical Exam  Physical Exam Vitals and nursing note reviewed.  HENT:     Head: Normocephalic.  Pulmonary:     Effort: Pulmonary effort is normal.    Review of Systems  Respiratory:  Negative for cough.   Cardiovascular:  Negative for chest pain.  Gastrointestinal:  Negative for abdominal pain, nausea and vomiting.  Neurological:  Negative for dizziness and headaches.  Psychiatric/Behavioral:  Positive for substance abuse. Negative for depression, hallucinations and suicidal ideas. The patient is not nervous/anxious.    Blood pressure 133/89, pulse 66, temperature (!) 97.4 F (36.3 C), temperature source Oral, resp. rate 17, SpO2 97 %. There is no height or weight on file to calculate BMI.  Treatment Plan Summary:Wesley Lee 42 y/o, male patient presented to [facility] as a walk in voluntarily requesting assistance with meth and alcohol detox and treatment.  Patient was admitted to [facility] for detox and residential rehab placement Patient went to halfway house but was told to get detox first.  He was started on COWS and PRN's at [facility] but patient denies using any opiates.  His last COWS was 1 and he has not used any other PRN medications other than hydroxyzine and trazodone. Will discontinue COWS and keep on monitoring his symptoms.  Will Start CIWA. Patient is reporting history of bipolar but denies having any manic episodes in the absence of drug use.  Currently denies depression, anxiety and manic symptoms.   Will keep on monitoring symptoms and need for mood stabilizer.  Daily contact with patient to assess and evaluate symptoms and progress in treatment Labs- UDS-positive for amphetamine, oxazepam, meth -Urine positive for small blood Send labs-CBC, CMP, lipid panel, TSH, HbA1c ordered at [facility]-needs to be collected.   Methamphetamine abuse Cocaine use -Patient was started on COWS and PRN's at [facility] but patient denies using any opiates.  His last COWS was 0 and he has not used any other PRN medications other than hydroxyzine and trazodone -Interested in rehab.  Patient will call halfway house tomorrow. -Will consider starting naltrexone.  -Continue group therapy  Alcohol use - Start CIWA with PRN Ativan for CIWA >10.  - Continue PRN Imodium, Zofran, Hydroxyzine, Mylanta, MOM - Start Vitamin B1 and multivitamin  Reported h/o Bipolar  Denies manic episodes in the absence of drug use.  Currently, denies depression, anxiety or manic symptoms.  Denies SI, HI, AVH. -Monitor symptoms and need for mood stabilizer. -Continue PRN's hydroxyzine, trazodone.  Nerve damage in shoulder  gabapentin to home dose 800 mg 3 times daily  ADD No h/o withdrawal seizures.   home Wellbutrin XL 300 mg daily-   Dispo-TBD.  Social work is seeking residential substance abuse treatment.  Ardis Hughs, NP 02/03/2023 2:54 PM

## 2023-02-03 NOTE — Group Note (Signed)
Group Topic: Wellness  Group Date: 02/03/2023 Start Time: 0715 End Time: 0745 Facilitators: Emmit Pomfret D, NT  Department: [facility]  Number of Participants: 2  Group Focus: acceptance Treatment Modality:  Psychoeducation Interventions utilized were support Purpose: increase insight  Name: Wesley Lee Date of Birth: 10/14/1980  MR: [id number]    Level of Participation: active Quality of Participation: attentive Interactions with others: gave feedback Mood/Affect: appropriate Triggers (if applicable): n/a Cognition: coherent/clear Progress: Significant Response: n/a Plan: follow-up needed  Patients Problems:  Patient Active Problem List   Diagnosis Date Noted   Substance induced mood disorder (HCC) 02/01/2023   Polysubstance abuse (HCC) 02/01/2023

## 2023-02-03 NOTE — ED Notes (Signed)
Patient continues to rest with no sxs of distress. Will continue to monitor for safety ?

## 2023-02-03 NOTE — Group Note (Signed)
Group Topic: Positive Affirmations  Group Date: 02/03/2023 Start Time: 1045 End Time: 1140 Facilitators: Maeola Sarah  Department: [facility]  Number of Participants: 5  Group Focus: affirmation Treatment Modality:  Psychoeducation Interventions utilized were patient education Purpose: regain self-worth and reinforce self-care  Name: Wesley Lee Date of Birth: 1980-12-31  MR: [id number]    Level of Participation: active Quality of Participation: attentive, cooperative, and engaged Interactions with others: gave feedback Mood/Affect: appropriate and positive Triggers (if applicable): N/A Cognition: coherent/clear Progress: Gaining insight Response: Patient shared that his positive affirmation is "I will workout to take away my blues" Plan: patient will be encouraged to continue to attend groups  Patients Problems:  Patient Active Problem List   Diagnosis Date Noted   Substance induced mood disorder (HCC) 02/01/2023   Polysubstance abuse (HCC) 02/01/2023

## 2023-02-03 NOTE — ED Notes (Signed)
Patient remains asleep in bed without issue or complaint.  No evidence of withdrawal at this time.  Will monitor.

## 2023-02-03 NOTE — Group Note (Signed)
Group Topic: Fears and Unhealthy Coping Skills  Group Date: 02/03/2023 Start Time: 1500 End Time: 1530 Facilitators: Jenean Lindau, RN  Department: [facility]  Number of Participants: 4  Group Focus: chemical dependency issues Treatment Modality:  Psychoeducation Interventions utilized were patient education Purpose: express feelings and increase insight  Name: Wesley Lee Date of Birth: [birth year]  MR: [id number]    Level of Participation: moderate Quality of Participation: attentive and cooperative Interactions with others: gave feedback Mood/Affect: appropriate Triggers (if applicable):   Cognition: coherent/clear Progress: Gaining insight Response:   Plan: follow-up needed  Patients Problems:  Patient Active Problem List   Diagnosis Date Noted   Substance induced mood disorder (HCC) 02/01/2023   Polysubstance abuse (HCC) 02/01/2023

## 2023-02-03 NOTE — ED Notes (Signed)
Patient A&Ox4. Patient denies SI/Hi and AVH. Patient denies any physical complaints when asked. No acute distress noted. Support and encouragement provided. Routine safety checks conducted according to facility protocol. Encouraged patient to notify staff if thoughts of harm toward self or others arise. Patient verbalize understanding and agreement. Q 15  min safety checks remain in place.Will continue to monitor for safety.

## 2023-02-03 NOTE — Tx Team (Signed)
LCSW met with patient to assess current mood, affect, physical state, and inquire about needs/goals while here in [facility] and after discharge. Patient reports he presented due to his substance use and needing to detox. Patient reports he has been using meth, fentanyl, and drinking alcohol since 2016. Patient reports a 3 year period of sobriety while being in prison for common law robbery. Patient reports he is homeless for the first in his life, and reports prior to this admission, he was staying at [facility] in [place], [place]. Patient reports he was only there a day and then was advised to detox before returning back. Patient reports having support from his brother/friend Michelle Nasuti [phone] regarding seeking further treatment. Patient reports he was referred to [facility] in [place] and then referred from [facility] in [place] to the [facility]. Patient reports he is unsure of the plan at this time and has provided permission for LCSW to follow up with friend for collateral. Patient reports he does have an upcoming court on June 10th for Driving while license revoked, however denies being on any probation or having any further legal charges.   LCSW reached out to [facility] in [place] [phone] and spoke with Coordinator Lelon Perla. Per Mrs.Gunther, she has a one year rehabilitation program that she offers, however it is based on income. Mrs. Myer Haff reports the patient currently does not have income at this time and she took him in on a pro-bono basis in order to get him off the streets. Mrs. Myer Haff reports that she allowed the patient to stay one night at the house, and was opening other residents doors, telling others someone was out to get him, and asking for drugs. Mrs. Myer Haff reports the patient is a "pre-boxer"  Mrs. Myer Haff reports she is not able to assist the patient at this time as she believes he needs a higher level of care. Mrs. Myer Haff reports if patient can be  stabilized on medication and receive the appropriate treatment then she would consider taking him back into the home. Mrs. Myer Haff reports she would like to be updated on where the patient is admitted as she would need to get the patient's clothes to him. Mrs. Myer Haff aware that LCSW will follow up once update can be provided. No other needs were reported at this time.     LCSW contact friend Michelle Nasuti [phone] to gain collateral. Per Gerilyn Pilgrim, he has not seen the patient in about 15 years, however ran into him at the grocery store and realized he needed help. Gerilyn Pilgrim reports he took him to [facility] for additional assistance and reports the Representative was so patient with the patient and allowed him to stay overnight until she could get him further help. However, he got to the facility and started asking for drugs. Gerilyn Pilgrim reports he does not believe the patient is appropriate for the level of care offered at [facility] at this time. Gerilyn Pilgrim would like to see the patient receive further treatment and enhance his wellbeing. Gerilyn Pilgrim is supportive and reports he would like to be update on where the patient goes once discharged. Gerilyn Pilgrim reports the patient's family has not completely written him off, however they want him to seek treatment before any additional help is extended. Patient has two daughters that live in [place] as well.    Fernande Boyden, LCSW Clinical Social Worker [facility] Ph: [phone]

## 2023-02-03 NOTE — ED Notes (Signed)
Patient observed/assessed in room in bed appearing in no immediate distress resting peacefully. Q15 minute checks continued by MHT and nursing staff. Will continue to monitor and support. 

## 2023-02-03 NOTE — ED Notes (Signed)
Patient was provided with breakfast 

## 2023-02-03 NOTE — ED Notes (Signed)
Patient has been awake and alert on unit.  He showered a short while ago and has made his needs known when they arise.  Patient continues to refuse to allow RN to take blood despite attempts to reassure him.  No evidence of withdrawal at this time.  Will monitor.

## 2023-02-04 DIAGNOSIS — F22 Delusional disorders: Secondary | ICD-10-CM | POA: Diagnosis not present

## 2023-02-04 DIAGNOSIS — F1994 Other psychoactive substance use, unspecified with psychoactive substance-induced mood disorder: Secondary | ICD-10-CM | POA: Diagnosis not present

## 2023-02-04 DIAGNOSIS — R450 Nervousness: Secondary | ICD-10-CM | POA: Diagnosis not present

## 2023-02-04 DIAGNOSIS — F1514 Other stimulant abuse with stimulant-induced mood disorder: Secondary | ICD-10-CM | POA: Diagnosis not present

## 2023-02-04 MED ORDER — BISMUTH SUBSALICYLATE 262 MG PO CHEW: 524.0000 mg | CHEWABLE_TABLET | ORAL | Status: DC | PRN

## 2023-02-04 MED ORDER — ALUM & MAG HYDROXIDE-SIMETH 200-200-20 MG/5ML PO SUSP: 30.0000 mL | ORAL | Status: DC | PRN

## 2023-02-04 MED ORDER — ACETAMINOPHEN 325 MG PO TABS
650.0000 mg | ORAL_TABLET | Freq: Four times a day (QID) | ORAL | Status: DC | PRN
  Administered 2023-02-08 – 2023-02-09 (×2): 650 mg via ORAL
  Filled 2023-02-04 (×3): qty 2

## 2023-02-04 MED ORDER — SENNA 8.6 MG PO TABS: 1.0000 | ORAL_TABLET | Freq: Every evening | ORAL | Status: DC | PRN

## 2023-02-04 MED ORDER — LACTULOSE 10 GM/15ML PO SOLN: 30.0000 g | Freq: Two times a day (BID) | ORAL | Status: DC | PRN

## 2023-02-04 MED ORDER — ONDANSETRON HCL 4 MG PO TABS: 8.0000 mg | ORAL_TABLET | Freq: Three times a day (TID) | ORAL | Status: DC | PRN

## 2023-02-04 MED ORDER — POLYETHYLENE GLYCOL 3350 17 G PO PACK: 17.0000 g | PACK | Freq: Every day | ORAL | Status: DC | PRN

## 2023-02-04 MED ORDER — HYDROXYZINE HCL 25 MG PO TABS
25.0000 mg | ORAL_TABLET | Freq: Three times a day (TID) | ORAL | Status: DC | PRN
  Administered 2023-02-04: 25 mg via ORAL
  Filled 2023-02-04 (×2): qty 1

## 2023-02-04 NOTE — Group Note (Signed)
Group Topic: Social Support  Group Date: 02/04/2023 Start Time: 0800 End Time: 0830 Facilitators: Rae Lips B  Department: [facility]  Number of Participants: 7  Group Focus: acceptance Treatment Modality:  Individual Therapy Interventions utilized were leisure development Purpose: improve communication skills  Name: Wesley Lee Date of Birth: May 23, 1981  MR: [id number]    Level of Participation: active Quality of Participation: attentive Interactions with others: gave feedback Mood/Affect: appropriate Triggers (if applicable): NA Cognition: coherent/clear Progress: Gaining insight Response: NA Plan: patient will be encouraged to Keep going to groups  Patients Problems:  Patient Active Problem List   Diagnosis Date Noted   Substance induced mood disorder (HCC) 02/01/2023   Polysubstance abuse (HCC) 02/01/2023

## 2023-02-04 NOTE — Discharge Planning (Signed)
LCSW attempted to contact the [employer] [place] [phone] which assists with locating placement for patients, however received no answer. LCSW attempted to call the [facility] for [facility] [phone], however received no answer.  Referrals were sent to the following agencies: [facility], [facility] and [facility].  Contact numbers for patient follow up has been provided for: [employer] and [employer] in [place], [place]. LCSW will continue to follow up and provide updates as received.   Fernande Boyden, LCSW Clinical Social Worker [facility] Ph: [phone]

## 2023-02-04 NOTE — Group Note (Signed)
Group Topic: Relapse and Recovery  Group Date: 02/04/2023 Start Time: 1130 End Time: 1150 Facilitators: Jenean Lindau, RN  Department: [facility]  Number of Participants: 5  Group Focus: anger management and coping skills Treatment Modality:  Psychoeducation Interventions utilized were patient education Purpose: explore maladaptive thinking, express feelings, and increase insight  Name: [name] Date of Birth: [birth year]  MR: [id number]    Level of Participation: moderate Quality of Participation: attentive and cooperative Interactions with others: gave feedback Mood/Affect: appropriate Triggers (if applicable):   Cognition: goal directed Progress: Gaining insight Response:   Plan: follow-up needed  Patients Problems:  Patient Active Problem List   Diagnosis Date Noted   Substance induced mood disorder (HCC) 02/01/2023   Polysubstance abuse (HCC) 02/01/2023

## 2023-02-04 NOTE — Group Note (Signed)
Group Topic: Coping Skills  Group Date: 02/04/2023 Start Time: 0130 End Time: 0300 Facilitators: Lenny Pastel  Department: [facility]  Number of Participants: 7  Group Focus: clarity of thought, communication, coping skills, impulsivity, personal responsibility, relapse prevention, self-awareness, and substance abuse education Treatment Modality:  Cognitive Behavioral Therapy Interventions utilized were assignment, exploration, and group exercise Purpose: enhance coping skills, explore maladaptive thinking, express feelings, express irrational fears, improve communication skills, increase insight, regain self-worth, reinforce self-care, relapse prevention strategies, and trigger / craving management  Name: Wesley Lee Date of Birth: [birth year]  MR: [id number]    Level of Participation: active Quality of Participation: limited  Interactions with others: guarded  Mood/Affect: appropriate and closed / guarded Triggers (if applicable): Patient was not able to identify triggers for substance use.  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 Patient minimized substance use, however lately stated having the urge of wanting to use meth. Patient reports his coping strategies are working out and talking to family in order to help him stay focused. Patient was unable to identify any other coping strategies to help with addiction.  Plan: follow-up needed and referral / recommendations  Patients Problems:  Patient Active Problem List   Diagnosis Date Noted   Substance induced mood disorder (HCC) 02/01/2023   Polysubstance abuse (HCC) 02/01/2023

## 2023-02-04 NOTE — ED Notes (Signed)
Patient resting quietly in bed with eyes closed. Respirations equal and unlabored, skin warm and dry, NAD. No change in assessment or acuity. Q 15 minute safety checks remain in place.   

## 2023-02-04 NOTE — ED Notes (Signed)
Patient was awake for dinner and has been visible throughout the day on the unit.  He is without withdrawal.  No somatic distress.  Will monitor and provide a safe environment.

## 2023-02-04 NOTE — ED Notes (Signed)
Patient is awake and alert on unit.  He is pleasant and cooperative with care.  No distress or signs of withdrawal.  He is social and well related.  Will monitor.

## 2023-02-04 NOTE — ED Notes (Signed)
Received patient this PM. Patient day room watching TV with peers. Patient respirations are even and unlabored. Will continue to monitor for safety.

## 2023-02-04 NOTE — ED Notes (Signed)
Patient denies SI/HI and AVH. Patient is eating breakfast in the dayroom. Patient is being monitored for safety.

## 2023-02-04 NOTE — ED Provider Notes (Cosign Needed Addendum)
[facility] Progress Note  Date: 02/04/23 Patient Name: Wesley Lee MRN: [id number] Chief Complaint:  Chief Complaint  Patient presents with   Addiction Problem     Diagnosis:  Final diagnoses:  Substance induced mood disorder (HCC)  Polysubstance abuse (HCC)   Subjective: Patient reports feeling well. He endorses good sleep. He denies experiencing withdrawal symptoms at this time and denies any cravings. Patient tells me he used to drink alcohol every day, but lately has been drinking only once a week (2-3 beers each time). He denies any IV drug use.  He says the gabapentin has been helpful to treat the nerve pain in his shoulder. He denies recent sexual activity (last time was 5 years ago before prison) and reports recently [facility] which he says were negative.  Denies passive or active SI.  Total Time spent with patient: 45 minutes   Mental Status Exam   Appearance and Grooming: Patient appears overall clean.  Behavior: The patient appears in no acute distress, and during the interview, was calm, focused, required minimal redirection, and behaving appropriately to scenario. He was able to follow commands and compliant to requests and made good eye contact.  The patient did not appear internally or externally preoccupied.  Attitude: Patient's attitude towards the interviewer and other individuals present was cooperative and open.  Motor activity: There was no notable abnormal facial movements and no notable abnormal extremity movements.  Speech: The volume of his speech was within an appropriate range and normal in quantity. The rate was appropriately paced with a normal rhythm. Responses were normal in latency. There was  mildly reduced speech intelligibility .  Mood: "Alright"  Affect: Patient's affect is euthymic with broad range and even fluctuations. His affect is appropriate for the topic of  conversation. ------------------------------------------------------------------------------------------------------------------------- Perception The patient describes no hallucinations.  Thought Content The patient describes no delusional thoughts.  Patient at the time of interview denies passive or active suicidal ideations. He denies homicidal intent.  Thought Process The patient's thought process is linear and is goal-directed.  Insight The patient at the time of interview demonstrates fair insight, as evidenced by understanding of mental health condition/s and acknowledgement of substance use disorder/s.  Judgement The patient over the past 24 hours demonstrates good judgement, as evidenced by help-seeking behavior, such as voluntary admission to mental health facility, requesting outpatient resources, and seeking placement for rehab, adhering to psychotropic medication regimen, actively participating in group therapy, and engaging appropriately with staff / other patients.  ROS and Physical Exam  Review of Systems  Constitutional: Negative.   Respiratory: Negative.    Cardiovascular: Negative.   Gastrointestinal: Negative.   Genitourinary: Negative.    Blood pressure 109/70, pulse 70, temperature 98.2 F (36.8 C), temperature source Oral, resp. rate 18, SpO2 98 %. There is no height or weight on file to calculate BMI. Physical Exam Vitals and nursing note reviewed.  Constitutional:      Appearance: Normal appearance.  HENT:     Head: Normocephalic and atraumatic.  Pulmonary:     Effort: Pulmonary effort is normal.  Neurological:     General: No focal deficit present.     Mental Status: He is alert. Mental status is at baseline.     Assets  Assets:Desire for Improvement; Communication Skills; Resilience   Past Medical History:  History reviewed. No pertinent past medical history. History reviewed. No pertinent surgical history.  Family History:  History  reviewed.  No pertinent family history.  Social History:  Social History   Socioeconomic History   Marital status: Unknown    Spouse name: Not on file   Number of children: Not on file   Years of education: Not on file   Highest education level: Not on file  Occupational History   Not on file  Tobacco Use   Smoking status: Not on file   Smokeless tobacco: Not on file  Substance and Sexual Activity   Alcohol use: Not on file   Drug use: Not on file   Sexual activity: Not on file  Other Topics Concern   Not on file  Social History Narrative   Not on file   Social Determinants of Health   Financial Resource Strain: Not on file  Food Insecurity: Food Insecurity Present (02/01/2023)   Hunger Vital Sign    Worried About Running Out of Food in the Last Year: Sometimes true    Ran Out of Food in the Last Year: Sometimes true  Transportation Needs: Patient Declined (02/01/2023)   PRAPARE - Administrator, Civil Service (Medical): Patient declined    Lack of Transportation (Non-Medical): Patient declined  Physical Activity: Not on file  Stress: Not on file  Social Connections: Not on file  Intimate Partner Violence: Patient Declined (02/01/2023)   Humiliation, Afraid, Rape, and Kick questionnaire    Fear of Current or Ex-Partner: Patient declined    Emotionally Abused: Patient declined    Physically Abused: Patient declined    Sexually Abused: Patient declined    SDOH:  SDOH Screenings   Food Insecurity: Food Insecurity Present (02/01/2023)  Housing: Medium Risk (02/01/2023)  Transportation Needs: Patient Declined (02/01/2023)  Utilities: Patient Declined (02/01/2023)  Depression (PHQ2-9): Low Risk  (02/03/2023)    ASSESSMENT  Wesley Lee 42 y/o, male patient voluntarily presenting to [facility] for meth and alcohol detox and treatment, also seeking residential rehab placement  Problem List: #Stimulant use disorder Patient tested positive for amphetamines and methamphetamines  on UDS and endorses meth use 3-7x a week. Endorses occasional cocaine use. - can consider starting naltrexone    #Alcohol use disorder Patient endorses drinking 2-3 beers over the past week. Says he used to drink 2-3 beers daily. Denies prior history of withdrawal seizures, blackout episodes, or delirium tremens. Latest CIWAs 0 - continue CIWA with PRN Ativan for CIWA >10, may d/c tomorrow if CIWAs remain insignificant - continue home gabapentin 800 mg TID - continue thiamine and multivitamin   Reported h/o Bipolar  Denies manic episodes in the absence of drug use.  Currently, denies depression, anxiety or manic symptoms. Denies SI, HI, AVH. -monitor symptoms and need for mood stabilizer.  ADHD Previously diagnosed. Patient used to take methylphenidate as a child - continue home bupropion 300 mg daily  Nerve damage in shoulder - gabapentin, bupropion as above  PRN medications for symptomatic management: -- continue acetaminophen 650 mg every 6 hours as needed for mild to moderate pain, fever, and headaches -- continue hydroxyzine 25 mg three times a day as needed for anxiety -- continue bismuth subsalicylate 524 mg oral chewable tablet every 3 hours as needed for diarrhea / loose stools -- continue senna 8.6 mg oral at bedtime and polyethylene glycol 17 g oral daily as needed for mild to moderate constipation -- continue ondansetron 8 mg every 8 hours as needed for nausea or vomiting -- continue aluminum-magnesium hydroxide + simethicone 30 mL every 4 hours as needed for heartburn or indigestion  Long Term Goals: Improvement in symptoms so as ready for discharge  Short Term Goals: Ability to identify changes in lifestyle to reduce recurrence of condition, verbalize feelings, identify and develop effective coping behaviors, maintain clinical measurements within normal limits, and identify triggers associated with substance abuse/mental health issues will improve. Improvement in ability  to demonstrate self-control and comply with prescribed medications.  Disposition: awaiting residential rehabilitation placement  I discussed my assessment, planned testing and intervention for the patient with Dr. Lucianne Muss who agrees with my formulated course of action.  Signed: Augusto Gamble, MD 02/04/2023, 9:13 AM

## 2023-02-04 NOTE — Group Note (Signed)
Group Topic: Wellness  Group Date: 02/04/2023 Start Time: 1000 End Time: 1115 Facilitators: Cristal Ford  Department: [facility]  Number of Participants: 6  Group Focus: self-awareness Treatment Modality:  Psychoeducation Interventions utilized were patient education Purpose: reinforce self-care  Name: Wesley Lee Date of Birth: [birth year]  MR: [id number]    Level of Participation: active Quality of Participation: attentive and cooperative Interactions with others: gave feedback Mood/Affect: positive Triggers (if applicable): na Cognition: coherent/clear Progress: Moderate Response: na Plan: follow-up needed  Patients Problems:  Patient Active Problem List   Diagnosis Date Noted   Substance induced mood disorder (HCC) 02/01/2023   Polysubstance abuse (HCC) 02/01/2023

## 2023-02-05 DIAGNOSIS — F1514 Other stimulant abuse with stimulant-induced mood disorder: Secondary | ICD-10-CM | POA: Diagnosis not present

## 2023-02-05 DIAGNOSIS — F1994 Other psychoactive substance use, unspecified with psychoactive substance-induced mood disorder: Secondary | ICD-10-CM | POA: Diagnosis not present

## 2023-02-05 DIAGNOSIS — F22 Delusional disorders: Secondary | ICD-10-CM | POA: Diagnosis not present

## 2023-02-05 DIAGNOSIS — R450 Nervousness: Secondary | ICD-10-CM | POA: Diagnosis not present

## 2023-02-05 NOTE — ED Notes (Signed)
Pt was upset last night called this [facility] a bitch bc I said we weren't allowed to give cereal for snack time. Woke up asking for the TV on told him we couldn't do that yet we had to wait for morning shift. I went ahead and made them coffee for first shift he yelled at the top of his lungs we need real coffee. Also said he doesn't see why we have these rules in place bc they don't really matter.

## 2023-02-05 NOTE — Group Note (Signed)
Group Topic: Healthy Self Image and Positive Change  Group Date: 02/05/2023 Start Time: 1007 End Time: 1045 Facilitators: Vonzell Schlatter B  Department: [facility]  Number of Participants: 4  Group Focus: goals/reality orientation Treatment Modality:  Psychoeducation Interventions utilized were group exercise Purpose: express feelings and regain self-worth  Name: Elroy Bachtell Date of Birth: [birth year]  MR: [id number]    Level of Participation: minimal Quality of Participation: attentive and cooperative Interactions with others: gave feedback Mood/Affect: positive Triggers (if applicable): na Cognition: coherent/clear Progress: Moderate Response: na Plan: follow-up needed  Patients Problems:  Patient Active Problem List   Diagnosis Date Noted   Substance induced mood disorder (HCC) 02/01/2023   Polysubstance abuse (HCC) 02/01/2023

## 2023-02-05 NOTE — ED Notes (Signed)
Pt sleeping@this time. Breathing even and unlabored. Will continue to monitor for safety 

## 2023-02-05 NOTE — ED Notes (Signed)
Patient in milieu. Environment is secured. Will continue to monitor for safety. 

## 2023-02-05 NOTE — Group Note (Addendum)
Group Topic: Communication  Group Date: 02/05/2023 Start Time: 1200 End Time: 1230 Facilitators: Merrie Roof, RN  Department: [facility]  Number of Participants: 6  Group Focus: safety plan Treatment Modality:  Patient-Centered Therapy Interventions utilized were assignment Purpose: express feelings  Name: Wesley Lee Date of Birth: 10/01/1980  MR: [id number]    Level of Participation: active Quality of Participation: cooperative Interactions with others: gave feedback Mood/Affect: appropriate Triggers (if applicable):  Cognition: coherent/clear Progress: Significant Response:  Plan: patient will be encouraged to continue therapy  Patients Problems:  Patient Active Problem List   Diagnosis Date Noted  . Substance induced mood disorder (HCC) 02/01/2023  . Polysubstance abuse (HCC) 02/01/2023
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
## 2023-02-05 NOTE — ED Notes (Signed)
Pt sitting in dayroom watching television. Calm and cooperative. Will continue to monitor for safety

## 2023-02-05 NOTE — ED Notes (Signed)
Patient denies SI,HI,AVH. Medication administered without difficulty to patient. Patient is cooperative and interacts well with staff. Respiratory is even and unlabored. No distress noted. Patient sleeping or resting in bed at present. Patient stated no complaints at present. Will continue to monitor for safety. 

## 2023-02-05 NOTE — ED Notes (Signed)
Patient alert and oriented x 3. Denies SI/HI/AVH. Denies intent or plan to harm self or others. Routine conducted according to faculty protocol. Encourage patient to notify staff with any needs or concerns. Patient verbalized agreement and understanding. Will continue to monitor for safety. 

## 2023-02-05 NOTE — Discharge Planning (Signed)
LCSW followed up with the following facilities regarding referral:  [facility]: Patient provided number to complete phone screening  [facility]: received no answer [facility]: Per Admission, patient is not appropriate for their facility at this time. Recommendation would be for patient to complete a 28 day program prior to being considered.  [facility] in [place], [place]: Per Zollie Beckers, patient would not be appropriate if he remains on any psychotropic meds. Patient would also need to have his court date pushed back during the time of 90 day program as residents are not allowed to leave the location. Agency also does not provide transportation if accepted. LCSW received chart and patient appears to be on meds for mental health purposes. Patient made aware that this would likely not be an option due med restrictions.  [facility]: Currently under review; update to be provided later today.   LCSW contacted the [facility] of Court to explore if patient's court date could be pushed back if needed for placement. LCSW spoke with Mrs. Raynelle Chary [phone] who reports she would just need a letter via email stating where the patient is going if the court date needs to be postponed. Email: [email or web address].     Update to be provided to MD. LCSW will continue to follow and provide updates as received.    Fernande Boyden, LCSW Clinical Social Worker [facility] Ph: [phone]

## 2023-02-06 DIAGNOSIS — F1994 Other psychoactive substance use, unspecified with psychoactive substance-induced mood disorder: Secondary | ICD-10-CM | POA: Diagnosis not present

## 2023-02-06 DIAGNOSIS — F22 Delusional disorders: Secondary | ICD-10-CM | POA: Diagnosis not present

## 2023-02-06 DIAGNOSIS — R450 Nervousness: Secondary | ICD-10-CM | POA: Diagnosis not present

## 2023-02-06 DIAGNOSIS — F1514 Other stimulant abuse with stimulant-induced mood disorder: Secondary | ICD-10-CM | POA: Diagnosis not present

## 2023-02-06 NOTE — Discharge Planning (Signed)
LCSW received update from patient that he has been accepted to [facility] [place] in [place] and can likely look at being admitted on Saturday 5/11. Patient was advised to call two representatives within 30 mins for confirmation.   LCSW followed up with patient for updates. Per patient, they have updated him in their system and a bed will be available for him when he is appropriate for discharge. LCSW followed up to confirm information provided by patient. LCSW contacted Duwayne Heck from [facility] [place] and was informed that patient has been accepted into one of her apartments. Duwayne Heck reports the patient can report to [address] [place], [place] [address] between the hours of 9am and 4:00pm. Mrs. Danielle reports the patient can come tomorrow if he is ready. Agency to provide notification if so. Patient will need scripts and samples if available, as they will transport the [facility] to [facility]. Mrs. Duwayne Heck reports that the patient would be responsible for his own transportation to the facility. No other needs were reported at this time.   LCSW contact friend Michelle Nasuti [phone] to provide update. Per Gerilyn Pilgrim, he does not think he will be able to transport the patient to the facility as he is about 3 hours away from [place]. Gerilyn Pilgrim asked if facility could provide transport and was informed that LCSW will follow up with Supervisor regarding transport. LCSW will also attempt to reach the patient's daughters to explore assistance. Gerilyn Pilgrim expressed understanding and reports he is working now to try to obtain the patient's clothing from the previous facility he was at. Gerilyn Pilgrim to be provided an update once received.   LCSW attempted to get in contact with the patient's daughter Makani Marchesani, however received no answer. LCSW left non emergency voice message requesting phone call back. No other needs to report at this time.   Patient asked if someone would be able  to assist him with getting to his court appt on March 10, 2023 as he was informed by Mrs. Danielle that the facility could not transport. Patient encouraged to follow up with family and is aware that LCSW will attempt to follow up as well.   Fernande Boyden, LCSW Clinical Social Worker [facility] Ph: [phone]

## 2023-02-06 NOTE — ED Notes (Signed)
Pt calm and cooperative at this hour. No apparent distress. RR even and unlabored. Monitored for safety.

## 2023-02-06 NOTE — Group Note (Signed)
Group Topic: Identity and Relationships  Group Date: 02/06/2023 Start Time: 1010 End Time: 1055 Facilitators: Vonzell Schlatter B  Department: [facility]  Number of Participants: 5  Group Focus: daily focus Treatment Modality:  Psychoeducation Interventions utilized were reality testing Purpose: regain self-worth  Name: Wesley Lee Date of Birth: 26-Oct-1980  MR: [id number]    Level of Participation: active Quality of Participation: attentive and cooperative Interactions with others: gave feedback Mood/Affect: positive Triggers (if applicable): na Cognition: coherent/clear Progress: Moderate Response: na Plan: follow-up needed  Patients Problems:  Patient Active Problem List   Diagnosis Date Noted   Substance induced mood disorder (HCC) 02/01/2023   Polysubstance abuse (HCC) 02/01/2023

## 2023-02-06 NOTE — Progress Notes (Signed)
Progress note   D: Pt seen at nurse's station. Pt denies SI, HI, AVH. Pt rates pain  0/10. Pt rates anxiety  0/10 and depression  0/10. Pt is pleasant and cooperative. Pt states he is going to [facility] so he can stay clean and also work. Pt says he has an upcoming court date in June. Endorses good appetite and sleep. Attended group this evening. No other concerns noted at this time.  A: Pt provided support and encouragement. Pt given scheduled medication as prescribed. PRNs as appropriate. Q15 min checks for safety.   R: Pt safe on the unit. Will continue to monitor.

## 2023-02-06 NOTE — Discharge Planning (Signed)
Referral was sent to [facility] for review on 05/07. LCSW contacted Admissions Coordinator Chanetta Marshall this morning regarding and was informed that referral is still under review. Anticipate update on today. LCSW contacted [facility] to inquire about referral and patient is still under review. Awaiting updates from these facilities. Patient asked for more information about [facility] [place] and LCSW provided contact number for patient to follow up. LCSW will continue to follow and provide updates as received.    Fernande Boyden, LCSW Clinical Social Worker [facility] Ph: [phone]

## 2023-02-06 NOTE — Group Note (Signed)
Group Topic: Relapse and Recovery  Group Date: 02/06/2023 Start Time: 0800 End Time: 0900 Facilitators: Rae Lips B  Department: [facility]  Number of Participants: 3  Group Focus: chemical dependency issues Treatment Modality:  Leisure Development Interventions utilized were leisure development Purpose: relapse prevention strategies  Name: Jefri Pezza Date of Birth: 22-May-1981  MR: [id number]    Level of Participation: active Quality of Participation: attentive Interactions with others: gave feedback Mood/Affect: appropriate Triggers (if applicable): NA Cognition: coherent/clear Progress: Gaining insight Response: NA Plan: patient will be encouraged to keep going to groups.  Patients Problems:  Patient Active Problem List   Diagnosis Date Noted   Substance induced mood disorder (HCC) 02/01/2023   Polysubstance abuse (HCC) 02/01/2023

## 2023-02-06 NOTE — ED Provider Notes (Deleted)
I have reviewed the note by NP Rankin and discussed the case.  In summary, patient meets criteria for admission to [facility] for ongoing substance use rehabilitation and mood stabilization. He specifically denies SI, HI, AVH and is able to contract for safety while at [facility].  Mariel Craft, MD
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
## 2023-02-06 NOTE — ED Notes (Addendum)
Pt sleeping@this time. Breathing even and unlabored. Will continue to monitor for safety 

## 2023-02-06 NOTE — Group Note (Signed)
Group Topic: Decisional Balance/Substance Abuse  Group Date: 02/06/2023 Start Time: 0130 End Time: 0230 Facilitators: Lenny Pastel  Department: [facility]  Number of Participants: 3  Group Focus: acceptance, activities of daily living skills, chemical dependency issues, communication, coping skills, family, feeling awareness/expression, goals/reality orientation, personal responsibility, problem solving, relapse prevention, safety plan, self-awareness, and self-esteem Treatment Modality:  Behavior Modification Therapy and Spiritual Interventions utilized were clarification, exploration, story telling, and support Purpose: enhance coping skills, explore maladaptive thinking, express feelings, improve communication skills, increase insight, regain self-worth, reinforce self-care, relapse prevention strategies, and trigger / craving management  Name: Wesley Lee Date of Birth: 10/17/1980  MR: [id number]    Level of Participation: active Quality of Participation: cooperative Interactions with others: gave feedback Mood/Affect: appropriate Triggers (if applicable): patient was not able to identify triggers at this time.  Cognition: concrete Progress: Gaining insight Response: Patient reports he enjoyed listening to the inspirational video on today. Patient reports it challenges him to think about the goals he has set for himself in te past. 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 However, feels like with his setbacks, he is not worthy to carry such load. LCSW provided brief supportive counseling to the patient and he was receptive to the feedback provided. Patient reports he wants to do better with his relationships with his daughters and looks forward to making these changes.  Plan: referral / recommendations Plan: referral / recommendations  Patients Problems:  Patient  Active Problem List   Diagnosis Date Noted   Substance induced mood disorder (HCC) 02/01/2023   Polysubstance abuse (HCC) 02/01/2023

## 2023-02-06 NOTE — ED Notes (Signed)
Patient denies SI/HI and AVH. Patient has been interacting with other patients and staff without complication. Patient has been in a good mood. Patient is being monitored for safety.

## 2023-02-06 NOTE — ED Notes (Signed)
Pt asleep at this hour. No apparent distress. RR even and unlabored. Monitored for safety.  

## 2023-02-06 NOTE — Group Note (Signed)
Group Topic: Relapse and Recovery  Group Date: 02/06/2023 Start Time: 0800 End Time: 0959 Facilitators: Emmit Pomfret D, NT  Department: [facility]  Number of Participants: 4  Group Focus: community group, problem solving, relapse prevention, safety plan, and substance abuse education Treatment Modality:  Psychoeducation Interventions utilized were other AA Meeting, problem solving, and support Purpose: enhance coping skills, increase insight, and relapse prevention strategies  Name: Garald Buonocore Date of Birth: September 26, 1981  MR: [id number]    Level of Participation: moderate Quality of Participation: attentive Interactions with others: gave feedback Mood/Affect: appropriate Triggers (if applicable): n/a Cognition: coherent/clear Progress: Significant Response: n/a Plan: follow-up needed  Patients Problems:  Patient Active Problem List   Diagnosis Date Noted   Substance induced mood disorder (HCC) 02/01/2023   Polysubstance abuse (HCC) 02/01/2023

## 2023-02-07 DIAGNOSIS — F1514 Other stimulant abuse with stimulant-induced mood disorder: Secondary | ICD-10-CM | POA: Diagnosis not present

## 2023-02-07 DIAGNOSIS — R450 Nervousness: Secondary | ICD-10-CM | POA: Diagnosis not present

## 2023-02-07 DIAGNOSIS — F22 Delusional disorders: Secondary | ICD-10-CM | POA: Diagnosis not present

## 2023-02-07 DIAGNOSIS — F1994 Other psychoactive substance use, unspecified with psychoactive substance-induced mood disorder: Secondary | ICD-10-CM | POA: Diagnosis not present

## 2023-02-07 MED ORDER — TRAZODONE HCL 100 MG PO TABS: 100.0000 mg | ORAL_TABLET | Freq: Every evening | ORAL | 0 refills | Status: AC | PRN

## 2023-02-07 MED ORDER — TRAZODONE HCL 100 MG PO TABS
100.0000 mg | ORAL_TABLET | Freq: Every evening | ORAL | Status: DC | PRN
  Administered 2023-02-07 – 2023-02-09 (×3): 100 mg via ORAL
  Filled 2023-02-07: qty 1
  Filled 2023-02-07: qty 14
  Filled 2023-02-07 (×2): qty 1

## 2023-02-07 MED ORDER — GABAPENTIN 400 MG PO CAPS: 800.0000 mg | ORAL_CAPSULE | Freq: Three times a day (TID) | ORAL | 0 refills | Status: AC

## 2023-02-07 MED ORDER — TRAZODONE HCL 50 MG PO TABS
50.0000 mg | ORAL_TABLET | ORAL | Status: AC
  Administered 2023-02-07: 50 mg via ORAL
  Filled 2023-02-07: qty 1

## 2023-02-07 MED ORDER — CALCIUM CARBONATE ANTACID 500 MG PO CHEW
1.0000 | CHEWABLE_TABLET | Freq: Once | ORAL | Status: AC
  Administered 2023-02-07: 200 mg via ORAL
  Filled 2023-02-07: qty 1

## 2023-02-07 NOTE — ED Notes (Signed)
Received patient this AM. Patient has been making phone calls and interacting with peers in day room. Patient respirations are even and unlabored. Will continue to monitor for safety.

## 2023-02-07 NOTE — Discharge Planning (Signed)
LCSW contacted the patient's daughter Katy Apo [phone] to provide update. Daughter appears to be supportive of the patient. Daughter provide the contact information for where the patient is to report on tomorrow. Patient needs to report between the hours of 9:00am and 4:00pm. Daughter reports she will reach out to her uncle to see if he would be willing to transport the patient on tomorrow to the facility, and will follow back up with LCSW. Daughter also reports she is aware of the patient's upcoming court date and believes the county will provide an extension for him as he is well known to them. Daughter aware that LCSW will follow up with the [facility] [place] to inform of where the patient will be going next. Daughter was also provided the number for follow up. No other needs were reported at this time. Daughter reports appreciation for LCSW assistance with locating placement for the patient. LCSW to await phone call back regarding transport on tomorrow.   Update to be provided to MD and patient. Email sent to court for confirmation of next level of care.   Fernande Boyden, LCSW Clinical Social Worker [facility] Ph: [phone]
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
## 2023-02-07 NOTE — Group Note (Signed)
Group Topic: Balance in Life  Group Date: 02/07/2023 Start Time: 0130 End Time: 0215 Facilitators: Lenny Pastel  Department: [facility]  Number of Participants: 4  Group Focus: activities of daily living skills, chemical dependency issues, clarity of thought, communication, coping skills, daily focus, feeling awareness/expression, healthy friendships, impulsivity, leisure skills, personal responsibility, problem solving, relapse prevention, self-awareness, self-esteem, social skills, and substance abuse education Treatment Modality:  Cognitive Behavioral Therapy Interventions utilized were assignment, exploration, group exercise, mental fitness, problem solving, and support Purpose: enhance coping skills, explore maladaptive thinking, express feelings, express irrational fears, improve communication skills, increase insight, regain self-worth, reinforce self-care, relapse prevention strategies, and trigger / craving management  Name: Warner Barling Date of Birth: 08/08/1981  MR: [id number]    Level of Participation: active Quality of Participation: cooperative Interactions with others: gave feedback Mood/Affect: appropriate Triggers (if applicable): N/A Cognition: Limited  Progress: Gaining insight Response: Patient actively participated in group on today. Group started off with introductions and group rules. Group members participated in a therapeutic activity that required active listening and communication skills. Group members were able to identify similarities and differences within the group. Patient interacted positively with staff and peers. No issues to report.  Plan: referral / recommendations  Patients Problems:  Patient Active Problem List   Diagnosis Date Noted   Substance induced mood disorder (HCC) 02/01/2023   Polysubstance abuse (HCC) 02/01/2023

## 2023-02-07 NOTE — ED Notes (Signed)
Pt asleep at this hour. No apparent distress. RR even and unlabored. Monitored for safety.  

## 2023-02-07 NOTE — ED Notes (Signed)
Pt is in the Dayroom watching TV. Respirations are even and unlabored. No acute distress noted. Will continue to monitor for safety.  
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
## 2023-02-07 NOTE — ED Notes (Signed)
Pt c/o not being able to sleep. Notified provider. Received new orders.

## 2023-02-07 NOTE — ED Notes (Signed)
Patient denies SI,HI,AVH. Morning medication administered without difficulty to patient. Patient has been cooperative and interacts well with staff. Patient shows no signs of ETOH withdrawal. Respiratory is even and unlabored. No distress noted. Patient sleeping or resting in bed at present. Will continue to monitor for safety.

## 2023-02-07 NOTE — Group Note (Signed)
Group Topic: Relapse and Recovery  Group Date: 02/07/2023 Start Time: 0740 End Time: 0800 Facilitators: Rae Lips B  Department: [facility]  Number of Participants: 5  Group Focus: acceptance Treatment Modality:  Leisure Development Interventions utilized were support Purpose: express feelings  Name: Wesley Lee Date of Birth: Dec 02, 1980  MR: [id number]    Level of Participation: active Quality of Participation: attention seeking Interactions with others: sarcastic Mood/Affect: appropriate Triggers (if applicable): NA Cognition: coherent/clear Progress: Minimal Response: During group he said he didn't want to stop drinking as soon as he gets his hands on a beer when he gets out he's going to drink it. He has no intentions of stopping.  Plan: patient will be encouraged to keep going to groups.   Patients Problems:  Patient Active Problem List   Diagnosis Date Noted   Substance induced mood disorder (HCC) 02/01/2023   Polysubstance abuse (HCC) 02/01/2023

## 2023-02-07 NOTE — Progress Notes (Signed)
Pt c/o stomach upset. Pt refused Maalox. Says Tums works better. Provider notified. New orders given.

## 2023-02-07 NOTE — Discharge Planning (Signed)
LCSW followed up with patient's daughter Wesley Lee to explore if updates were available. Daughter reports she will not be able to transport the patient on tomorrow and has not been able to get in contact with any of her family that would be willing to transport. Daughter made aware that LCSW will follow up with MD regarding transportation needs and will follow up to confirm.   LCSW spoke with MD Lucianne Muss who approves transport for patient to [facility] in [place]. Address: [address] [place], [place] [address] between the hours of 9am and 4:00pm. Facility Representative Danielle's contact information is [phone]. LCSW contacted [facility] [employer] to inform of transportation need. LCSW spoke with Alona Bene who confirmed transportation will be arranged for the patient for 9:30am. MD and team to be made aware. No other needs to report at this time.   LCSW contacted patient's daughter to inform of plan. Daughter expressed appreciation of LCSW assistance. LCSW to sign off at this time.   Wesley Boyden, LCSW Clinical Social Worker [place] [facility] Ph: [phone]

## 2023-02-08 DIAGNOSIS — F22 Delusional disorders: Secondary | ICD-10-CM | POA: Diagnosis not present

## 2023-02-08 DIAGNOSIS — F1994 Other psychoactive substance use, unspecified with psychoactive substance-induced mood disorder: Secondary | ICD-10-CM | POA: Diagnosis not present

## 2023-02-08 DIAGNOSIS — R450 Nervousness: Secondary | ICD-10-CM | POA: Diagnosis not present

## 2023-02-08 DIAGNOSIS — F1514 Other stimulant abuse with stimulant-induced mood disorder: Secondary | ICD-10-CM | POA: Diagnosis not present

## 2023-02-08 NOTE — ED Provider Notes (Signed)
Behavioral Health Progress Note  Date and Time: 02/08/2023 11:03 AM Name: Wesley Lee MRN:  [id number]  Subjective:   Wesley Lee is a 42 yr old male who presented to [facility] on 5/3 and was admitted to [facility] on 5/4 for Detox.  PPHx is significant for Substance Induced Mood Disorder and Polysubstance Abuse (Meth, Opioids, EtOH).  He reports that he is doing well today.  He reports he has been talking with his family and his family does not think it would be a good idea for him to discharge to [facility].  He reports that he agrees with this because he is having significant cravings to use meth and opiates and thinks that if he does go to [facility] he will immediately leave and use again.  He reports that he feels like going to residential treatment so that he can relearn how to live without using.  Discussed with him that we would have to wait Monday as no facilities accept over the weekend and social work will assist with this.  He reported understanding and is agreeable with this.  He reports he is also interested in starting Suboxone.  Discussed with him that this facility does not start Suboxone and he would have to address this later and he reported understanding.  He reports no SI, HI, or AVH.  He reports sleep is good.  He reports appetite is doing good.  He reports no concerns at present.   Diagnosis:  Final diagnoses:  Substance induced mood disorder (HCC)  Polysubstance abuse (HCC)    Total Time spent with patient: 30 minutes  Past Psychiatric History: Substance Induced Mood Disorder and Polysubstance Abuse (Meth, Opioids, EtOH). Past Medical History: Nerve Damage, Restless Leg Syndrome  Family History: Reports None Family Psychiatric  History: Reports None Social History: Lives in halfway house, unemployed  Additional Social History:    Pain Medications: See MAR Prescriptions: See MAR Over the Counter: See MAR History of alcohol / drug use?: Yes Longest period of sobriety (when/how long):  Unknown UTA due patient AMS Negative Consequences of Use:  (UTA) Withdrawal Symptoms:  (UTA) Name of Substance 1: Methamphetamines 1 - Age of First Use: UTA due to patient being actively impaired at the time of assessment 1 - Amount (size/oz): UTA due to patient being actively impaired at the time of assessment 1 - Frequency: UTA due to patient being actively impaired at the time of assessment 1 - Duration: UTA due to patient being actively impaired at the time of assessment 1 - Last Use / Amount: patient per notes reported 3 days ago although per observation patient is currently impaired 1 - Method of Aquiring: illegally 1- Route of Use: UTA                  Sleep: Good  Appetite:  Good  Current Medications:  Current Facility-Administered Medications  Medication Dose Route Frequency Provider Last Rate Last Admin   acetaminophen (TYLENOL) tablet 650 mg  650 mg Oral Q6H PRN Augusto Gamble, MD       And   hydrOXYzine (ATARAX) tablet 25 mg  25 mg Oral TID PRN Augusto Gamble, MD   25 mg at 02/04/23 2106   And   bismuth subsalicylate (PEPTO BISMOL) chewable tablet 524 mg  524 mg Oral Q3H PRN Augusto Gamble, MD       And   senna (SENOKOT) tablet 8.6 mg  1 tablet Oral QHS PRN Augusto Gamble, MD       And  polyethylene glycol (MIRALAX / GLYCOLAX) packet 17 g  17 g Oral Daily PRN Augusto Gamble, MD       And   ondansetron [facility]) tablet 8 mg  8 mg Oral Q8H PRN Augusto Gamble, MD       And   alum & mag hydroxide-simeth (MAALOX/MYLANTA) 200-200-20 MG/5ML suspension 30 mL  30 mL Oral Q4H PRN Augusto Gamble, MD       buPROPion (WELLBUTRIN XL) 24 hr tablet 300 mg  300 mg Oral Daily Karsten Ro, MD   300 mg at 02/08/23 1324   gabapentin (NEURONTIN) capsule 800 mg  800 mg Oral TID Karsten Ro, MD   800 mg at 02/08/23 4010   multivitamin with minerals tablet 1 tablet  1 tablet Oral Daily Karsten Ro, MD   1 tablet at 02/08/23 2725   thiamine (VITAMIN B1) tablet 100 mg  100 mg Oral Daily Karsten Ro,  MD   100 mg at 02/08/23 3664   traZODone (DESYREL) tablet 100 mg  100 mg Oral QHS PRN Augusto Gamble, MD   100 mg at 02/07/23 2121   Current Outpatient Medications  Medication Sig Dispense Refill   buPROPion (WELLBUTRIN XL) 300 MG 24 hr tablet Take 300 mg by mouth daily.     gabapentin (NEURONTIN) 400 MG capsule Take 2 capsules (800 mg total) by mouth 3 (three) times daily. 180 capsule 0   traZODone (DESYREL) 100 MG tablet Take 1 tablet (100 mg total) by mouth at bedtime as needed for sleep. 30 tablet 0    Labs  Lab Results:  Admission on 01/31/2023  Component Date Value Ref Range Status   Color, Urine 01/31/2023 YELLOW  YELLOW Final   APPearance 01/31/2023 CLEAR  CLEAR Final   Specific Gravity, Urine 01/31/2023 1.006  1.005 - 1.030 Final   pH 01/31/2023 6.0  5.0 - 8.0 Final   Glucose, UA 01/31/2023 NEGATIVE  NEGATIVE mg/dL Final   Hgb urine dipstick 01/31/2023 SMALL (A)  NEGATIVE Final   Bilirubin Urine 01/31/2023 NEGATIVE  NEGATIVE Final   Ketones, ur 01/31/2023 NEGATIVE  NEGATIVE mg/dL Final   Protein, ur 40/34/7425 NEGATIVE  NEGATIVE mg/dL Final   Nitrite 95/63/8756 NEGATIVE  NEGATIVE Final   Leukocytes,Ua 01/31/2023 NEGATIVE  NEGATIVE Final   RBC / HPF 01/31/2023 0-5  0 - 5 RBC/hpf Final   WBC, UA 01/31/2023 0-5  0 - 5 WBC/hpf Final   Bacteria, UA 01/31/2023 NONE SEEN  NONE SEEN Final   Squamous Epithelial / HPF 01/31/2023 0-5  0 - 5 /HPF Final   Performed at [facility], [address], [place], [place] [address]   POC Amphetamine UR 01/31/2023 Positive (A)  NONE DETECTED (Cut Off Level 1000 ng/mL) Final   POC Secobarbital (BAR) 01/31/2023 None Detected  NONE DETECTED (Cut Off Level 300 ng/mL) Final   POC Buprenorphine (BUP) 01/31/2023 None Detected  NONE DETECTED (Cut Off Level 10 ng/mL) Final   POC Oxazepam (BZO) 01/31/2023 Positive (A)  NONE DETECTED (Cut Off Level 300 ng/mL) Final   POC Cocaine UR 01/31/2023 None Detected  NONE DETECTED (Cut Off Level 300 ng/mL) Final    POC Methamphetamine UR 01/31/2023 Positive (A)  NONE DETECTED (Cut Off Level 1000 ng/mL) Final   POC Morphine 01/31/2023 None Detected  NONE DETECTED (Cut Off Level 300 ng/mL) Final   POC Methadone UR 01/31/2023 None Detected  NONE DETECTED (Cut Off Level 300 ng/mL) Final   POC Oxycodone UR 01/31/2023 None Detected  NONE DETECTED (Cut Off  Level 100 ng/mL) Final   POC Marijuana UR 01/31/2023 None Detected  NONE DETECTED (Cut Off Level 50 ng/mL) Final    Blood Alcohol level:  No results found for: "ETH"  Metabolic Disorder Labs: No results found for: "HGBA1C", "MPG" No results found for: "PROLACTIN" No results found for: "CHOL", "TRIG", "HDL", "CHOLHDL", "VLDL", "LDLCALC"  Therapeutic Lab Levels: No results found for: "LITHIUM" No results found for: "VALPROATE" No results found for: "CBMZ"  Physical Findings   PHQ2-9    Flowsheet Row ED from 01/31/2023 in [facility]  PHQ-2 Total Score 1      Flowsheet Row ED from 01/31/2023 in [facility]  C-SSRS RISK CATEGORY No Risk        Musculoskeletal  Strength & Muscle Tone: within normal limits Gait & Station: normal Patient leans: N/A  Psychiatric Specialty Exam  Presentation  General Appearance:  Appropriate for Environment; Casual  Eye Contact: Good  Speech: Clear and Coherent; Normal Rate  Speech Volume: Normal  Handedness: Right   Mood and Affect  Mood: Euthymic  Affect: Appropriate; Congruent   Thought Process  Thought Processes: Coherent; Goal Directed  Descriptions of Associations:Intact  Orientation:Full (Time, Place and Person)  Thought Content:WDL; Logical  Diagnosis of Schizophrenia or Schizoaffective disorder in past: No    Hallucinations:Hallucinations: None  Ideas of Reference:None  Suicidal Thoughts:Suicidal Thoughts: No  Homicidal Thoughts:Homicidal Thoughts: No   Sensorium  Memory: Immediate  Fair  Judgment: Good  Insight: Good   Executive Functions  Concentration: Good  Attention Span: Good  Recall: Good  Fund of Knowledge: Fair  Language: Fair   Psychomotor Activity  Psychomotor Activity: Psychomotor Activity: Normal   Assets  Assets: Communication Skills; Desire for Improvement; Resilience   Sleep  Sleep: Sleep: Good   No data recorded  Physical Exam  Physical Exam Vitals and nursing note reviewed.  Constitutional:      General: He is not in acute distress.    Appearance: Normal appearance. He is normal weight. He is not ill-appearing or toxic-appearing.  Pulmonary:     Effort: Pulmonary effort is normal.  Musculoskeletal:        General: Normal range of motion.  Neurological:     Mental Status: He is alert.    Review of Systems  Respiratory:  Negative for cough and shortness of breath.   Cardiovascular:  Negative for chest pain.  Gastrointestinal:  Negative for abdominal pain, constipation, diarrhea, nausea and vomiting.  Neurological:  Negative for dizziness, weakness and headaches.  Psychiatric/Behavioral:  Negative for depression, hallucinations and suicidal ideas. The patient is not nervous/anxious.    Blood pressure (!) 124/96, pulse 78, temperature 98.8 F (37.1 C), temperature source Tympanic, resp. rate 18, SpO2 97 %. There is no height or weight on file to calculate BMI.  Treatment Plan Summary: Daily contact with patient to assess and evaluate symptoms and progress in treatment and Medication management  Wesley Lee is a 42 yr old male who presented to [facility] on 5/3 and was admitted to [facility] on 5/4 for Detox.  PPHx is significant for Substance Induced Mood Disorder and Polysubstance Abuse (Meth, Opioids, EtOH).   Wesley originally had planned discharge to [facility] today, however, after talking with his family he does not think that this would be a good plan for him as he feels like he would immediately relapse and use again.   He is now wanting to go to residential treatment and appreciate social work assistance with  this on Monday.  Will not make any changes to his medication at this time.  We will continue to monitor.    EtOH Use disorder  Stimulant Use Disorder: -Continue Gabapentin 800 mg TID -Continue thiamine and multivitamin -Can consider Naltrexone   #Intellectual disability Patient has been placed in special education in the past. His highest level of education is 10th grade. Speech pattern with reduced speech intelligibility coupled with gross deficits in comprehension during interview is suggestive of underlying ID. 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    #ADHD Previously diagnosed. Patient used to take methylphenidate as a child. -Continue home bupropion 300 mg daily    Nerve damage in shoulder -Continue Gabapentin 800 mg TID    PRN medications for symptomatic management: -- increase trazodone from 50 to 100 mg at bedtime PRN for insomnia -- continue acetaminophen 650 mg every 6 hours as needed for mild to moderate pain, fever, and headaches -- continue hydroxyzine 25 mg three times a day as needed for anxiety -- continue bismuth subsalicylate 524 mg oral chewable tablet every 3 hours as needed for diarrhea / loose stools -- continue senna 8.6 mg oral at bedtime and polyethylene glycol 17 g oral daily as needed for mild to moderate constipation -- continue ondansetron 8 mg every 8 hours as needed for nausea or vomiting -- continue aluminum-magnesium hydroxide + simethicone 30 mL every 4 hours as needed for heartburn or indigestion    Dispo: Wants to go to Residential Treatment   Lauro Franklin, MD 02/08/2023 11:03 AM

## 2023-02-08 NOTE — ED Notes (Signed)
Patient denying any distress at this time - will continue to monitor for safety

## 2023-02-08 NOTE — ED Notes (Signed)
Pt is in the bed sleeping. Respirations are even and unlabored. No acute distress noted. Will continue to monitor for safety. 

## 2023-02-08 NOTE — ED Notes (Addendum)
Pt is in the bed sleeping. Respirations are even and unlabored. No acute distress noted. Will continue to monitor for safety. 

## 2023-02-08 NOTE — ED Notes (Signed)
Patient is in a good mood. Patient has been resting in his room this morning. Patient decided not to go to [facility]. Patient denies SI/HI and AVH. Patient is being monitored for safety.

## 2023-02-08 NOTE — ED Notes (Signed)
Patient resting at this time - will continue to monitor for safety

## 2023-02-09 DIAGNOSIS — R450 Nervousness: Secondary | ICD-10-CM | POA: Diagnosis not present

## 2023-02-09 DIAGNOSIS — F1514 Other stimulant abuse with stimulant-induced mood disorder: Secondary | ICD-10-CM | POA: Diagnosis not present

## 2023-02-09 DIAGNOSIS — F1994 Other psychoactive substance use, unspecified with psychoactive substance-induced mood disorder: Secondary | ICD-10-CM | POA: Diagnosis not present

## 2023-02-09 DIAGNOSIS — F22 Delusional disorders: Secondary | ICD-10-CM | POA: Diagnosis not present

## 2023-02-09 NOTE — ED Notes (Signed)
Received patient this AM. Patient in day room having breakfast. Patient respirations are even and unlabored. Will continue to monitor for safety.

## 2023-02-09 NOTE — ED Notes (Signed)
Patient was provided with lunch 

## 2023-02-09 NOTE — Group Note (Signed)
Group Topic: Recovery Basics  Group Date: 02/08/2023 Start Time: 0740 End Time: 0930 Facilitators: Rae Lips B  Department: [facility]  Number of Participants: 7  Group Focus: abuse issues and activities of daily living skills Treatment Modality:  Leisure Development Interventions utilized were leisure development Purpose: enhance coping skills and express feelings  Name: Labarron Pianka Date of Birth: Dec 18, 1980  MR: [id number]    Level of Participation: active Quality of Participation: attentive Interactions with others: gave feedback Mood/Affect: appropriate Triggers (if applicable): NA Cognition: coherent/clear Progress: Gaining insight Response: NA Plan: patient will be encouraged to keep going to groups.  Patients Problems:  Patient Active Problem List   Diagnosis Date Noted   Substance induced mood disorder (HCC) 02/01/2023   Polysubstance abuse (HCC) 02/01/2023

## 2023-02-09 NOTE — ED Notes (Signed)
Patient was provided with dinner 

## 2023-02-09 NOTE — Group Note (Signed)
Group Topic: Wellness  Group Date: 02/09/2023 Start Time: 1135 End Time: 1205 Facilitators: Maeola Sarah  Department: [facility]  Number of Participants: 8  Group Focus: coping skills Treatment Modality:  Psychoeducation Interventions utilized were patient education Purpose: enhance coping skills and increase insight  Name: Karmine Rhoderick Date of Birth: 01-03-1981  MR: [id number]    Level of Participation: active Quality of Participation: attentive, cooperative, and engaged Interactions with others: positive and respectful Mood/Affect: appropriate and positive Triggers (if applicable): N/A Cognition: coherent/clear Progress: Gaining insight Response: Patient explained to staff and group members what coping skills are and when they are used as well as examples of negative & positive coping skills. Plan: patient will be encouraged to continue attending groups   Patients Problems:  Patient Active Problem List   Diagnosis Date Noted   Substance induced mood disorder (HCC) 02/01/2023   Polysubstance abuse (HCC) 02/01/2023

## 2023-02-09 NOTE — ED Provider Notes (Signed)
Behavioral Health Progress Note  Date and Time: 02/09/2023 8:24 AM Name: Wesley Lee MRN:  [id number]  Subjective:   Wesley Lee is a 42 yr old male who presented to [facility] on 5/3 and was admitted to [facility] on 5/4 for Detox.  PPHx is significant for Substance Induced Mood Disorder and Polysubstance Abuse (Meth, Opioids, EtOH).  He reports he is doing good today.  He reports no withdrawal symptoms.  He reports continuing to have cravings but that they are improving and better than they were yesterday.  He reports he still believes going to residential treatment is the best option for him due to the significant cravings he is having.  Discussed with him that social work would be able to assist him with this tomorrow and could go over all of his options with him and he reported understanding.  When asked if he would be willing to get blood work drawn he reports he has not.  He reports that in the past he was injected with stuff that he was not supposed to and so will not let this be done.  He reports no SI, HI, or AVH.  He reports his sleep is fair.  He reports his appetite is good.  He reports no other concerns at present.   Diagnosis:  Final diagnoses:  Substance induced mood disorder (HCC)  Polysubstance abuse (HCC)    Total Time spent with patient: 30 minutes  Past Psychiatric History: Substance Induced Mood Disorder and Polysubstance Abuse (Meth, Opioids, EtOH). Past Medical History: Nerve Damage, Restless Leg Syndrome  Family History: Reports None Family Psychiatric  History: Reports None Social History: Lives in halfway house, unemployed  Additional Social History:    Pain Medications: See MAR Prescriptions: See MAR Over the Counter: See MAR History of alcohol / drug use?: Yes Longest period of sobriety (when/how long): Unknown UTA due patient AMS Negative Consequences of Use:  (UTA) Withdrawal Symptoms:  (UTA) Name of Substance 1: Methamphetamines 1 - Age of First Use: UTA due  to patient being actively impaired at the time of assessment 1 - Amount (size/oz): UTA due to patient being actively impaired at the time of assessment 1 - Frequency: UTA due to patient being actively impaired at the time of assessment 1 - Duration: UTA due to patient being actively impaired at the time of assessment 1 - Last Use / Amount: patient per notes reported 3 days ago although per observation patient is currently impaired 1 - Method of Aquiring: illegally 1- Route of Use: UTA                  Sleep: Fair  Appetite:  Good  Current Medications:  Current Facility-Administered Medications  Medication Dose Route Frequency Provider Last Rate Last Admin   acetaminophen (TYLENOL) tablet 650 mg  650 mg Oral Q6H PRN Augusto Gamble, MD   650 mg at 02/08/23 1528   And   hydrOXYzine (ATARAX) tablet 25 mg  25 mg Oral TID PRN Augusto Gamble, MD   25 mg at 02/04/23 2106   And   bismuth subsalicylate (PEPTO BISMOL) chewable tablet 524 mg  524 mg Oral Q3H PRN Augusto Gamble, MD       And   senna (SENOKOT) tablet 8.6 mg  1 tablet Oral QHS PRN Augusto Gamble, MD       And   polyethylene glycol (MIRALAX / GLYCOLAX) packet 17 g  17 g Oral Daily PRN Augusto Gamble, MD       And  ondansetron (ZOFRAN) tablet 8 mg  8 mg Oral Q8H PRN Augusto Gamble, MD       And   alum & mag hydroxide-simeth (MAALOX/MYLANTA) 200-200-20 MG/5ML suspension 30 mL  30 mL Oral Q4H PRN Augusto Gamble, MD       buPROPion (WELLBUTRIN XL) 24 hr tablet 300 mg  300 mg Oral Daily Karsten Ro, MD   300 mg at 02/08/23 4696   gabapentin (NEURONTIN) capsule 800 mg  800 mg Oral TID Karsten Ro, MD   800 mg at 02/08/23 2135   multivitamin with minerals tablet 1 tablet  1 tablet Oral Daily Karsten Ro, MD   1 tablet at 02/08/23 2952   thiamine (VITAMIN B1) tablet 100 mg  100 mg Oral Daily Karsten Ro, MD   100 mg at 02/08/23 8413   traZODone (DESYREL) tablet 100 mg  100 mg Oral QHS PRN Augusto Gamble, MD   100 mg at 02/08/23 2135   Current  Outpatient Medications  Medication Sig Dispense Refill   buPROPion (WELLBUTRIN XL) 300 MG 24 hr tablet Take 300 mg by mouth daily.     gabapentin (NEURONTIN) 400 MG capsule Take 2 capsules (800 mg total) by mouth 3 (three) times daily. 180 capsule 0   traZODone (DESYREL) 100 MG tablet Take 1 tablet (100 mg total) by mouth at bedtime as needed for sleep. 30 tablet 0    Labs  Lab Results:  Admission on 01/31/2023  Component Date Value Ref Range Status   Color, Urine 01/31/2023 YELLOW  YELLOW Final   APPearance 01/31/2023 CLEAR  CLEAR Final   Specific Gravity, Urine 01/31/2023 1.006  1.005 - 1.030 Final   pH 01/31/2023 6.0  5.0 - 8.0 Final   Glucose, UA 01/31/2023 NEGATIVE  NEGATIVE mg/dL Final   Hgb urine dipstick 01/31/2023 SMALL (A)  NEGATIVE Final   Bilirubin Urine 01/31/2023 NEGATIVE  NEGATIVE Final   Ketones, ur 01/31/2023 NEGATIVE  NEGATIVE mg/dL Final   Protein, ur 24/40/1027 NEGATIVE  NEGATIVE mg/dL Final   Nitrite 25/36/6440 NEGATIVE  NEGATIVE Final   Leukocytes,Ua 01/31/2023 NEGATIVE  NEGATIVE Final   RBC / HPF 01/31/2023 0-5  0 - 5 RBC/hpf Final   WBC, UA 01/31/2023 0-5  0 - 5 WBC/hpf Final   Bacteria, UA 01/31/2023 NONE SEEN  NONE SEEN Final   Squamous Epithelial / HPF 01/31/2023 0-5  0 - 5 /HPF Final   Performed at [facility], [address] [place], [place] [address]   POC Amphetamine UR 01/31/2023 Positive (A)  NONE DETECTED (Cut Off Level 1000 ng/mL) Final   POC Secobarbital (BAR) 01/31/2023 None Detected  NONE DETECTED (Cut Off Level 300 ng/mL) Final   POC Buprenorphine (BUP) 01/31/2023 None Detected  NONE DETECTED (Cut Off Level 10 ng/mL) Final   POC Oxazepam (BZO) 01/31/2023 Positive (A)  NONE DETECTED (Cut Off Level 300 ng/mL) Final   POC Cocaine UR 01/31/2023 None Detected  NONE DETECTED (Cut Off Level 300 ng/mL) Final   POC Methamphetamine UR 01/31/2023 Positive (A)  NONE DETECTED (Cut Off Level 1000 ng/mL) Final   POC Morphine 01/31/2023 None Detected   NONE DETECTED (Cut Off Level 300 ng/mL) Final   POC Methadone UR 01/31/2023 None Detected  NONE DETECTED (Cut Off Level 300 ng/mL) Final   POC Oxycodone UR 01/31/2023 None Detected  NONE DETECTED (Cut Off Level 100 ng/mL) Final   POC Marijuana UR 01/31/2023 None Detected  NONE DETECTED (Cut Off Level 50 ng/mL) Final    Blood Alcohol  level:  No results found for: "ETH"  Metabolic Disorder Labs: No results found for: "HGBA1C", "MPG" No results found for: "PROLACTIN" No results found for: "CHOL", "TRIG", "HDL", "CHOLHDL", "VLDL", "LDLCALC"  Therapeutic Lab Levels: No results found for: "LITHIUM" No results found for: "VALPROATE" No results found for: "CBMZ"  Physical Findings   PHQ2-9    Flowsheet Row ED from 01/31/2023 in [facility]  PHQ-2 Total Score 1      Flowsheet Row ED from 01/31/2023 in [facility]  C-SSRS RISK CATEGORY No Risk        Musculoskeletal  Strength & Muscle Tone: within normal limits Gait & Station: normal Patient leans: N/A  Psychiatric Specialty Exam  Presentation  General Appearance:  Appropriate for Environment; Casual  Eye Contact: Good  Speech: Clear and Coherent; Normal Rate  Speech Volume: Normal  Handedness: Right   Mood and Affect  Mood: Euthymic  Affect: Appropriate; Congruent   Thought Process  Thought Processes: Coherent; Goal Directed  Descriptions of Associations:Intact  Orientation:Full (Time, Place and Person)  Thought Content:Logical; WDL  Diagnosis of Schizophrenia or Schizoaffective disorder in past: No    Hallucinations:Hallucinations: None  Ideas of Reference:Paranoia (about blood draw)  Suicidal Thoughts:Suicidal Thoughts: No  Homicidal Thoughts:Homicidal Thoughts: No   Sensorium  Memory: Immediate Fair  Judgment: Good  Insight: Fair   Art therapist  Concentration: Fair  Attention Span: Good  Recall: Good  Fund  of Knowledge: Fair  Language: Fair   Psychomotor Activity  Psychomotor Activity: Psychomotor Activity: Normal   Assets  Assets: Communication Skills; Desire for Improvement; Resilience   Sleep  Sleep: Sleep: Good   No data recorded  Physical Exam  Physical Exam Vitals and nursing note reviewed.  Constitutional:      General: He is not in acute distress.    Appearance: Normal appearance. He is normal weight. He is not ill-appearing or toxic-appearing.  Pulmonary:     Effort: Pulmonary effort is normal.  Musculoskeletal:        General: Normal range of motion.  Neurological:     Mental Status: He is alert.    Review of Systems  Respiratory:  Negative for cough and shortness of breath.   Cardiovascular:  Negative for chest pain.  Gastrointestinal:  Negative for abdominal pain, constipation, diarrhea, nausea and vomiting.  Neurological:  Negative for dizziness, weakness and headaches.  Psychiatric/Behavioral:  Negative for depression, hallucinations and suicidal ideas. The patient is not nervous/anxious.    Blood pressure 115/68, pulse 65, temperature 98.3 F (36.8 C), temperature source Tympanic, resp. rate 18, SpO2 100 %. There is no height or weight on file to calculate BMI.  Treatment Plan Summary: Daily contact with patient to assess and evaluate symptoms and progress in treatment and Medication management  Wesley Lee is a 42 yr old male who presented to [facility] on 5/3 and was admitted to [facility] on 5/4 for Detox.  PPHx is significant for Substance Induced Mood Disorder and Polysubstance Abuse (Meth, Opioids, EtOH).   Wesley Lee decided to not go to [facility] yesterday due to concerns he would quickly return to substance abuse.  He is now wanting to go to residential treatment and so she had social work's assistance with this tomorrow.  When asked if he would be willing to get his lab work drawn he continues to refuse due to paranoia.  Due to this we will not start naltrexone  given his unknown hepatic function and the risks associated.  We will not make any changes to his medication at this time.  We will continue to monitor.    EtOH Use disorder  Stimulant Use Disorder: -Continue Gabapentin 800 mg TID -Continue thiamine and multivitamin   #Intellectual disability Patient has been placed in special education in the past. His highest level of education is 10th grade. Speech pattern with reduced speech intelligibility coupled with gross deficits in comprehension during interview is suggestive of underlying ID. 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    #ADHD Previously diagnosed. Patient used to take methylphenidate as a child. -Continue home Bupropion 300 mg daily    Nerve damage in shoulder -Continue Gabapentin 800 mg TID    PRN medications for symptomatic management: -- continue trazodone from 50 to 100 mg at bedtime PRN for insomnia -- continue acetaminophen 650 mg every 6 hours as needed for mild to moderate pain, fever, and headaches -- continue hydroxyzine 25 mg three times a day as needed for anxiety -- continue bismuth subsalicylate 524 mg oral chewable tablet every 3 hours as needed for diarrhea / loose stools -- continue senna 8.6 mg oral at bedtime and polyethylene glycol 17 g oral daily as needed for mild to moderate constipation -- continue ondansetron 8 mg every 8 hours as needed for nausea or vomiting -- continue aluminum-magnesium hydroxide + simethicone 30 mL every 4 hours as needed for heartburn or indigestion    Dispo: Wants to go to Residential Treatment   Lauro Franklin, MD 02/09/2023 8:24 AM

## 2023-02-09 NOTE — ED Notes (Signed)
Patient resting in room in no sxs of distress - will continue to monitor for safety 

## 2023-02-09 NOTE — ED Notes (Signed)
Patient has been informed several times to not take snacks into his room and yet he continues to do so. This staff advised patient to bring all snacks from out of his room, but stated he didn't have anything; yet he placed some crackers, peanut butter and chips on the table.

## 2023-02-09 NOTE — ED Notes (Signed)
Patient denies SI,HI,AVH. Morning and afternoon medication administered without difficulty to patient. Patient participating well in groups. Patient is cooperative and interacts well with staff. Patient shows no signs of withdrawal. Respiratory is even and unlabored. No distress noted. Patient interacting with peers in day room at present. Will continue to monitor for safety.

## 2023-02-09 NOTE — ED Notes (Signed)
Patient was provided with breakfast 

## 2023-02-09 NOTE — ED Notes (Signed)
Patient A&Ox4. Patient denies SI/HI and AVH. Patient denies any physical complaints when asked. No acute distress noted. Support and encouragement provided. Routine safety checks conducted according to facility protocol. Encouraged patient to notify staff if thoughts of harm toward self or others arise. Patient verbalize understanding and agreement. Will continue to monitor for safety.    

## 2023-02-09 NOTE — ED Notes (Signed)
Patient resting quietly in bed with eyes closed. Respirations equal and unlabored, skin warm and dry, NAD. No change in assessment or acuity. Q 15 minute safety checks remain in place.   

## 2023-02-10 DIAGNOSIS — F1514 Other stimulant abuse with stimulant-induced mood disorder: Secondary | ICD-10-CM | POA: Diagnosis not present

## 2023-02-10 DIAGNOSIS — R450 Nervousness: Secondary | ICD-10-CM | POA: Diagnosis not present

## 2023-02-10 DIAGNOSIS — F1994 Other psychoactive substance use, unspecified with psychoactive substance-induced mood disorder: Secondary | ICD-10-CM | POA: Diagnosis not present

## 2023-02-10 DIAGNOSIS — F22 Delusional disorders: Secondary | ICD-10-CM | POA: Diagnosis not present

## 2023-02-10 NOTE — ED Notes (Signed)
Patient observed/assessed in room in bed appearing in no immediate distress resting peacefully. Q15 minute checks continued by MHT and nursing staff. Will continue to monitor and support. 

## 2023-02-10 NOTE — ED Notes (Signed)
Patient was provided with breakfast 

## 2023-02-10 NOTE — ED Notes (Signed)
Patient is currently standing at the nurses station speaking with Alona Bene, no distress noted will continue to monitor patient safety

## 2023-02-10 NOTE — Discharge Planning (Signed)
LCSW sent referral to [facility] facility on last week and was informed on Friday that patient was accepted for 02/10/23 by 9:00am. LCSW did not provide this information to patient as the plan was for the patient to discharge on Saturday to [facility] [place] in [place]. Patient was adamant about going to a program that would allow him to work in order to pay his traffic violation on March 10, 2023. Per report, patient's family did not agree with the patient discharging to [facility] [place] so patient remained at facility until this morning. LCSW informed patient that he was accepted to [facility] for today and that was still an option for him. Patient reports he will accept bed offer at this time. Patient asked if LCSW could follow up with the [facility] to inform of his transfer to facility. LCSW confirmed and will send email. Patient can transfer to [facility] on today by 9:00am. Update has been provided to the MD made aware. Patient will need a 14-30 day supply of medication and one month refill. Pharmacy able to complete. No nicotine gum allowed, however 14-30 day nicotine patches to be provided if needed. Taxi voucher provided to [employer] and called for transport. No other needs to report at this time.    LCSW to sign off at this time. Daughter Katy Apo [phone] contacted and provided an update regarding discharge plan.    Fernande Boyden, LCSW Clinical Social Worker [facility] Ph: [phone]

## 2023-02-11 ENCOUNTER — Ambulatory Visit (HOSPITAL_COMMUNITY)
Admission: EM | Admit: 2023-02-11 | Discharge: 2023-02-11 | Disposition: A | Discharge status: Home / Self Care | Payer: Medicaid Other

## 2023-02-11 NOTE — ED Notes (Signed)
Pt was not triaged due to being intoxicated, refusing to cooperate and being verbally aggressive. This Clinical research associate called [employer] non-emergency number for safety precaution per security.
